# Patient Record
Sex: Female | Born: 1970 | Race: White | Hispanic: No | Marital: Single | Smoking: Current every day smoker
Health system: Southern US, Community
[De-identification: ages and names within clinical notes are randomized; demographics above are authoritative.]

## PROBLEM LIST (undated history)

## (undated) DIAGNOSIS — F419 Anxiety disorder, unspecified: Secondary | ICD-10-CM

## (undated) DIAGNOSIS — Z9889 Other specified postprocedural states: Secondary | ICD-10-CM

## (undated) DIAGNOSIS — Z972 Presence of dental prosthetic device (complete) (partial): Secondary | ICD-10-CM

## (undated) DIAGNOSIS — K219 Gastro-esophageal reflux disease without esophagitis: Secondary | ICD-10-CM

## (undated) DIAGNOSIS — R112 Nausea with vomiting, unspecified: Secondary | ICD-10-CM

## (undated) DIAGNOSIS — F329 Major depressive disorder, single episode, unspecified: Secondary | ICD-10-CM

## (undated) DIAGNOSIS — F32A Depression, unspecified: Secondary | ICD-10-CM

## (undated) DIAGNOSIS — E785 Hyperlipidemia, unspecified: Secondary | ICD-10-CM

## (undated) HISTORY — DX: Anxiety disorder, unspecified: F41.9

## (undated) HISTORY — DX: Hyperlipidemia, unspecified: E78.5

## (undated) HISTORY — PX: MULTIPLE TOOTH EXTRACTIONS: SHX2053

## (undated) HISTORY — DX: Depression, unspecified: F32.A

---
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
## 2000-12-08 ENCOUNTER — Other Ambulatory Visit: Admission: RE | Admit: 2000-12-08 | Discharge: 2000-12-08 | Payer: Self-pay | Admitting: Family Medicine

## 2003-03-12 ENCOUNTER — Other Ambulatory Visit: Admission: RE | Admit: 2003-03-12 | Discharge: 2003-03-12 | Payer: Self-pay | Admitting: Family Medicine

## 2003-03-26 ENCOUNTER — Ambulatory Visit (HOSPITAL_COMMUNITY): Admission: RE | Admit: 2003-03-26 | Discharge: 2003-03-26 | Payer: Self-pay | Admitting: Family Medicine

## 2003-04-23 ENCOUNTER — Ambulatory Visit (HOSPITAL_COMMUNITY): Admission: RE | Admit: 2003-04-23 | Discharge: 2003-04-23 | Payer: Self-pay | Admitting: Family Medicine

## 2003-07-30 ENCOUNTER — Encounter: Admission: RE | Admit: 2003-07-30 | Discharge: 2003-07-30 | Payer: Self-pay | Admitting: Family Medicine

## 2003-07-30 ENCOUNTER — Ambulatory Visit (HOSPITAL_COMMUNITY): Admission: RE | Admit: 2003-07-30 | Discharge: 2003-07-30 | Payer: Self-pay | Admitting: Family Medicine

## 2003-08-06 ENCOUNTER — Ambulatory Visit (HOSPITAL_COMMUNITY): Admission: RE | Admit: 2003-08-06 | Discharge: 2003-08-06 | Payer: Self-pay | Admitting: Family Medicine

## 2003-08-06 ENCOUNTER — Encounter: Admission: RE | Admit: 2003-08-06 | Discharge: 2003-08-06 | Payer: Self-pay | Admitting: Family Medicine

## 2003-08-13 ENCOUNTER — Ambulatory Visit (HOSPITAL_COMMUNITY): Admission: RE | Admit: 2003-08-13 | Discharge: 2003-08-13 | Payer: Self-pay | Admitting: *Deleted

## 2003-08-20 ENCOUNTER — Inpatient Hospital Stay (HOSPITAL_COMMUNITY): Admission: RE | Admit: 2003-08-20 | Discharge: 2003-08-22 | Payer: Self-pay | Admitting: *Deleted

## 2003-08-20 ENCOUNTER — Encounter: Admission: RE | Admit: 2003-08-20 | Discharge: 2003-08-20 | Payer: Self-pay | Admitting: *Deleted

## 2003-08-24 ENCOUNTER — Inpatient Hospital Stay (HOSPITAL_COMMUNITY): Admission: AD | Admit: 2003-08-24 | Discharge: 2003-08-28 | Payer: Self-pay | Admitting: *Deleted

## 2003-08-25 ENCOUNTER — Encounter (INDEPENDENT_AMBULATORY_CARE_PROVIDER_SITE_OTHER): Payer: Self-pay | Admitting: Specialist

## 2004-10-16 ENCOUNTER — Other Ambulatory Visit: Admission: RE | Admit: 2004-10-16 | Discharge: 2004-10-16 | Payer: Self-pay | Admitting: Family Medicine

## 2005-12-24 ENCOUNTER — Other Ambulatory Visit: Admission: RE | Admit: 2005-12-24 | Discharge: 2005-12-24 | Payer: Self-pay | Admitting: Family Medicine

## 2010-01-28 ENCOUNTER — Inpatient Hospital Stay (HOSPITAL_COMMUNITY): Admission: AD | Admit: 2010-01-28 | Discharge: 2010-01-28 | Payer: Self-pay | Admitting: Obstetrics and Gynecology

## 2010-04-21 ENCOUNTER — Inpatient Hospital Stay (HOSPITAL_COMMUNITY): Admission: AD | Admit: 2010-04-21 | Payer: Self-pay | Source: Home / Self Care | Admitting: Obstetrics and Gynecology

## 2010-04-30 ENCOUNTER — Inpatient Hospital Stay (HOSPITAL_COMMUNITY)
Admission: RE | Admit: 2010-04-30 | Discharge: 2010-05-02 | Payer: Self-pay | Source: Home / Self Care | Attending: Obstetrics and Gynecology | Admitting: Obstetrics and Gynecology

## 2010-04-30 LAB — CBC
HCT: 36.7 % (ref 36.0–46.0)
Hemoglobin: 12.1 g/dL (ref 12.0–15.0)
MCH: 28.3 pg (ref 26.0–34.0)
MCHC: 33 g/dL (ref 30.0–36.0)
MCV: 85.9 fL (ref 78.0–100.0)
Platelets: 357 10*3/uL (ref 150–400)
RBC: 4.27 MIL/uL (ref 3.87–5.11)
RDW: 15 % (ref 11.5–15.5)
WBC: 18.3 10*3/uL — ABNORMAL HIGH (ref 4.0–10.5)

## 2010-04-30 LAB — RPR: RPR Ser Ql: NONREACTIVE

## 2010-04-30 LAB — SURGICAL PCR SCREEN
MRSA, PCR: NEGATIVE
Staphylococcus aureus: NEGATIVE

## 2010-05-04 ENCOUNTER — Encounter: Payer: Self-pay | Admitting: *Deleted

## 2010-05-05 LAB — ABO/RH: ABO/RH(D): A NEG

## 2010-05-05 LAB — CBC
HCT: 31.7 % — ABNORMAL LOW (ref 36.0–46.0)
Hemoglobin: 10.4 g/dL — ABNORMAL LOW (ref 12.0–15.0)
MCH: 28.3 pg (ref 26.0–34.0)
MCHC: 32.8 g/dL (ref 30.0–36.0)
MCV: 86.1 fL (ref 78.0–100.0)
Platelets: 319 K/uL (ref 150–400)
RBC: 3.68 MIL/uL — ABNORMAL LOW (ref 3.87–5.11)
RDW: 15.1 % (ref 11.5–15.5)
WBC: 18.2 K/uL — ABNORMAL HIGH (ref 4.0–10.5)

## 2010-05-08 NOTE — Op Note (Addendum)
NAMEJOSEPHINE, [name]              ACCOUNT NO.:  [id number]  MEDICAL RECORD NO.:  [id number]          PATIENT TYPE:  INP  LOCATION:  9148                          FACILITY:  [facility]  PHYSICIAN:  Hal Morales, M.D.DATE OF BIRTH:  1970-09-07  DATE OF PROCEDURE:  04/30/2010 DATE OF DISCHARGE:                              OPERATIVE REPORT   PREOPERATIVE DIAGNOSES:  Intrauterine pregnancy at 48 weeks' gestation, prior cesarean section, desire for repeat cesarean section.  POSTOPERATIVE DIAGNOSES:  Intrauterine pregnancy at 65 weeks' gestation, prior cesarean section, desire for repeat cesarean section.  Plus meconium-stained amniotic fluid.  PROCEDURE:  Repeat low transverse cesarean section.  SURGEON:  Hal Morales, M.D.  FIRST ASSISTANT:  Sanda Klein, CNM  ANESTHESIA:  Spinal.  ESTIMATED BLOOD LOSS:  750 mL.  COMPLICATIONS:  None.  FINDINGS:  The patient was delivered of a female infant weighing 6 pounds 13 ounces with Apgars of 8 and 9 at 1 and 5 minutes respectively. The uterus, tubes, and ovaries were normal for the gravid state.  PROCEDURE:  The patient was taken to the operating room after appropriate identification, placed on the operating table.  After the placement of a spinal anesthetic, she was placed in the supine position with left lateral tilt.  The abdomen and perineum were then prepped and a Foley catheter inserted into the bladder and connected to straight drainage.  The abdomen was draped as a sterile field.  10 mL of quarter percent Marcaine was infiltrated at the site of the previous cesarean section incision.  An incision was made in that area, and the abdomen opened in layers.  The peritoneum was entered and the bladder blade placed.  The uterus was incised approximately 2 cm above the uterovesical fold and that incision was taken laterally on either side bluntly.  The infant was delivered from the occiput transverse position and after  having the cord clamped and cut was handed off to the awaiting pediatricians.  The appropriate cord blood was drawn and the placenta allowed to separate from the uterus and then was removed from the operative field.  The uterine incision was closed with running interlocking suture of 0 Vicryl.  An imbricating suture of 0 Vicryl was then placed with adequate hemostasis.  Copious irrigation was carried out.  The abdominal peritoneum was closed with running suture of 2-0 Vicryl.  The rectus muscles were reapproximated in the midline with figure-of-eight suture of 2-0 Vicryl.  The rectus fascia was closed with running suture of 0 Vicryl then reinforced on either side of midline with figure-of-eight sutures of 0 Vicryl.  The subcutaneous tissue was irrigated and made hemostatic with Bovie cautery.  A subcuticular suture of 3-0 Monocryl was used to close the skin incision and a sterile dressing applied.  The patient was taken from the operating room to the recovery room in satisfactory condition having tolerated the procedure well with sponge and instrument counts correct. The infant went to the full-term nursery.     Hal Morales, M.D.     VPH/MEDQ  D:  04/30/2010  T:  04/30/2010  Job:  [id number]  Electronically Signed  by Dierdre Forth M.D. on 05/08/2010 03:28:53 PM

## 2010-05-13 NOTE — Discharge Summary (Signed)
Tracy [name], Tracy [name]              ACCOUNT NO.:  [id number]  MEDICAL RECORD NO.:  [id number]          PATIENT TYPE:  INP  LOCATION:  9148                          [facility]ILITY:  [facility]  PHYSICIAN:  Janine Limbo, M.D.DATE OF BIRTH:  1970-06-26  DATE OF ADMISSION:  04/30/2010 DATE OF DISCHARGE:  05/02/2010                              DISCHARGE SUMMARY   ADMITTING DIAGNOSES: 1. Intrauterine pregnancy at 41-2/7 weeks. 2. Previous cesarean section with desire for repeat. 3. History of intrauterine growth restriction. 4. Rh negative. 5. Advanced maternal age. 6. Smoker. 7. Late to care. 8. Elevated BMI.  DISCHARGE DIAGNOSES: 1. Intrauterine pregnancy at 41 weeks. 2. Prior cesarean section. 3. Desire for repeat. 4. Meconium-stained fluid. 5. History of intrauterine growth restriction. 6. Rh negative. 7. Advanced maternal age. 8. Smoker. 9. Late to care. 10.Elevated BMI.  PROCEDURES:  Repeat low transverse cesarean section.  HOSPITAL COURSE:  Tracy [name] is a 40 year old, gravida 3, para 1-0-1- 1, at 41-2/7 weeks' who presented on April 30, 2010, for repeat cesarean section.  Her pregnancy had been remarkable for: 1. History of previous cesarean section with now desire for repeat. 2. History of IUGR. 3. Rh negative. 4. Advanced maternal age. 5. Smoker. 6. Late to care. 7. Elevated BMI.  On the arrival, the patient was admitted to preoperative care.  She was then taken to the operating room, where a repeat low transverse cesarean section was performed by Dr. Pennie Rushing under spinal anesthesia.  Findings were a viable female weight 6 pounds 13 ounces, Apgars were 8 and 9. Infant was taken to the full-term nursery.  Mother was taken to recovery in good condition.  By postop day 1, the patient was doing well.  She was up ad lib.  She was using Motrin for pain secondary to a report of intolerance to narcotics per the patient.  Her hemoglobin was 10.4 previous value was  12.1, white blood cell count was 18.2 (it had been 18.3), and platelet count was 319.  Physical exam was within normal limits.  Incision was clean, dry and intact.  Lochia was scant.  The patient was breast-feeding.  She had mild anemia, but was hemodynamically stable.  By the next day, she was still having some trouble breast-feeding, but she was considering going home.  She was undecided about contraception.  She was then evaluated later that day and did desire discharge.  Again, her incision was clean, dry and intact.  She was having no issues and breast-feeding was improving.  She was deemed to receive full benefit of her hospital stay and was discharged home in stable condition.  DISCHARGE INSTRUCTIONS:  Per [facility] handout.  DISCHARGE MEDICATIONS: 1. Motrin 600 mg p.o. q.6 h. p.r.n. pain. 2. Vicodin 1-2 p.o. q.3-4 h. p.r.n. pain per patient request.  Discharge followup will occur in 6 weeks [facility] or p.r.n.     Renaldo Reel Emilee Hero, C.N.M.   ______________________________Arthur Zack Seal, M.D.    VLL/MEDQ  D:  05/03/2010  T:  05/04/2010  Job:  [id number]  Electronically Signed by Nigel Bridgeman C.N.M. on 05/04/2010 06:13:06 PM Electronically Signed by  Kirkland Hun M.D. on 05/13/2010 11:57:16 AM

## 2010-06-25 LAB — RH IMMUNE GLOBULIN WORKUP (NOT WOMEN'S HOSP)
ABO/RH(D): A NEG
Antibody Screen: NEGATIVE
Unit division: 0

## 2010-08-29 NOTE — Discharge Summary (Signed)
NAME:  Tracy [name], Tracy [name]                        ACCOUNT NO.:  [id number]   MEDICAL RECORD NO.:  [id number]                   PATIENT TYPE:  INP   LOCATION:  9116                                 [facility]ACILITY:  [facility]   PHYSICIAN:  Magnus Sinning. Rice, M.D.              DATE OF BIRTH:  1970/07/18   DATE OF ADMISSION:  08/24/2003  DATE OF DISCHARGE:  08/28/2003                                 DISCHARGE SUMMARY   DISCHARGE DIAGNOSES:  1. Intrauterine pregnancy at 37 and 3, delivered.  2. Low transverse cesarean section.  3. Repetitive fetal variable decelerations.  4. Intrauterine growth restriction at less than the fifth percentile.   DISCHARGE MEDICATIONS:  1. Motrin 600 mg p.o. t.i.d. with food.  2. Darvocet-N 100, 1-2 tablets q.4-6h. as needed for pain.  3. Micronor tablets, start on Sep 09, 2003.  4. Prenatal vitamins one per day for as long as breast feeding.   DISCHARGE INSTRUCTIONS:  The patient was discharged to home with follow up  at [facility] in 2 days for incision check and baby  weight check.   HISTORY AND PHYSICAL:  This is a 40 year old, G1, at 61 and 3 by first  trimester ultrasound, found to have increased umbilical Doppler readings  approximately 3 weeks prior to admission.  She was followed with NST's and  repeat Dopplers, and they were found to be normal until approximately 6 days  prior to this admission.  Umbilical Dopplers at that time were again  elevated, and fetal growth decreased from the 25th percentile down to the  less than 5th percentile.  The patient was admitted for induction of labor  with Cytotec and Cervidil used.  No progress made, so discharged home and  brought back 2 days afterwards for this admission.   PAST MEDICAL HISTORY:  Remarkable for tobacco use.   PAST OBSTETRICAL HISTORY:  One TAB in 1991.   MEDICATIONS:  1. Prenatal vitamins.  2. Protonix.  3. Sudafed.   ALLERGIES:  None.   PRENATAL LABORATORIES:  A  negative, antibody negative, RPR nonreactive,  rubella immune.  Hepatitis B surface antigen negative.  HIV negative.  Pap  within normal limits.  GC and Chlamydia negative.  MSAFP negative.  Glucola  112.  GBS negative.   PHYSICAL EXAMINATION:  VITAL SIGNS:  Blood pressure was 125/73, temperature  98 on admission.  ABDOMEN:  Exam otherwise unremarkable, except the abdomen showed a small  baby, approximately 5 pounds by Leopold's __________.  PELVIC:  Cervix was dimpled, external, 80% effaced.  Fetal heart tones were  noted to be 120-130 with accelerations present, no variables, and no  contractions were present on admission.   Dr. Gavin Potters was consulted about the admission, and agreed with care.  The  patient was ripened with Cytotec and contracted without significant change  in her cervix dilation.  She was started on Pitocin and began to have severe  and repetitive fetal variable decelerations.  These failed to improve with  oxygen, position, and fluid boluses.  It was therefore decided to proceed  with a primary low transverse cesarean section.  The patient received that  on Aug 25, 2003.  She recovered normally after the surgery, and went home on  Aug 28, 2003 with the discharge medications and instructions as listed  above.                                               Magnus Sinning. Rice, M.D.    KMR/MEDQ  D:  09/23/2003  T:  09/23/2003  Job:  [id number]

## 2010-08-29 NOTE — Op Note (Signed)
NAME:  Tracy [name], Tracy [name]                        ACCOUNT NO.:  [id number]   MEDICAL RECORD NO.:  [id number]                   PATIENT TYPE:  INP   LOCATION:  9116                                 [facility]   PHYSICIAN:  Magnus Sinning. Rice, M.D.              DATE OF BIRTH:  1970/09/10   DATE OF PROCEDURE:  08/25/2003  DATE OF DISCHARGE:  08/28/2003                                 OPERATIVE REPORT   PREOPERATIVE DIAGNOSES:  1. Intrauterine pregnancy at 37 weeks and three days.  2. Intrauterine growth restriction at less than the fifth percentile.  3. Repetitive fetal variable decelerations.   POSTOPERATIVE DIAGNOSES:  1. Intrauterine pregnancy at 37 weeks and three days.  2. Intrauterine growth restriction at less than the fifth percentile.  3. Repetitive fetal variable decelerations.   SURGEONS:  Conni Elliot, M.D.   ASSISTANT:  Magnus Sinning. Rice, M.D.   PROCEDURE:  Primary low transverse cesarean section.   ANESTHESIA:  Spinal.   ESTIMATED BLOOD LOSS:  500 cc.   URINE OUTPUT:  300 cc clear urine.   IV FLUIDS:  2700 lactated Ringer's.   FINDINGS:  Viable female, with Apgar's 8 at one minute and 9 at five  minutes; 4 pounds 6 ounces.  Cord pH 7.11.   DESCRIPTION OF PROCEDURE:  The patient was brought to the operating room and  her spinal anesthesia was placed and checked to make sure adequate.  A  Pfannenstiel skin incision was made in the low transverse incision, was  extended through to the underlying layer of fascia with the scalpel.  It was  nicked in the midline, extended laterally with Mayo scissors.  The rectus  muscles were separated in the midline.  The peritoneum was tented up and  entered sharply with Metzenbaum  scissors.  Bladder flap was created with  Metzenbaum's and then bluntly dissected.  The uterus was opened with the  scalpel and the incision extended digitally.   The infant's head was delivered atraumatically and suctioned.  The cord was  clamped; baby handed off to the awaiting pediatricians and a cord gas  obtained.   The uterine incision was then closed with running, locked Vicryl.  The  overlying rectus muscles were reapproximated. The fascia was closed with  Vicryl, and the skin was closed with sutures.  The sponge, needle and  instrument counts were correct x2.  The patient was sent to the recovery  room in good condition.                                               Magnus Sinning. Rice, M.D.    KMR/MEDQ  D:  09/23/2003  T:  09/24/2003  Job:  [id number]   cc:   Conni Elliot, M.D.  [address]  [place]  [place] [address]  Fax: [phone]

## 2013-02-14 ENCOUNTER — Encounter: Payer: Self-pay | Admitting: General Practice

## 2013-02-14 ENCOUNTER — Ambulatory Visit (INDEPENDENT_AMBULATORY_CARE_PROVIDER_SITE_OTHER): Payer: 59 | Admitting: General Practice

## 2013-02-14 ENCOUNTER — Encounter (INDEPENDENT_AMBULATORY_CARE_PROVIDER_SITE_OTHER): Payer: Self-pay

## 2013-02-14 VITALS — BP 125/84 | HR 96 | Temp 98.5°F | Ht 59.75 in | Wt 184.0 lb

## 2013-02-14 DIAGNOSIS — Z0000 Encounter for general adult medical examination without abnormal findings: Secondary | ICD-10-CM

## 2013-02-14 DIAGNOSIS — R5383 Other fatigue: Secondary | ICD-10-CM

## 2013-02-14 DIAGNOSIS — N926 Irregular menstruation, unspecified: Secondary | ICD-10-CM

## 2013-02-14 DIAGNOSIS — Z23 Encounter for immunization: Secondary | ICD-10-CM

## 2013-02-14 DIAGNOSIS — R5381 Other malaise: Secondary | ICD-10-CM

## 2013-02-14 DIAGNOSIS — Z124 Encounter for screening for malignant neoplasm of cervix: Secondary | ICD-10-CM

## 2013-02-14 LAB — POCT UA - MICROSCOPIC ONLY
Bacteria, U Microscopic: NEGATIVE
Casts, Ur, LPF, POC: NEGATIVE
Crystals, Ur, HPF, POC: NEGATIVE
Mucus, UA: NEGATIVE
RBC, urine, microscopic: NEGATIVE
WBC, Ur, HPF, POC: NEGATIVE
Yeast, UA: NEGATIVE

## 2013-02-14 LAB — POCT URINALYSIS DIPSTICK
Bilirubin, UA: NEGATIVE
Blood, UA: NEGATIVE
Glucose, UA: NEGATIVE
Ketones, UA: NEGATIVE
Leukocytes, UA: NEGATIVE
Nitrite, UA: NEGATIVE
Protein, UA: NEGATIVE
Spec Grav, UA: 1.01
Urobilinogen, UA: NEGATIVE
pH, UA: 7

## 2013-02-14 LAB — POCT URINE PREGNANCY: Preg Test, Ur: NEGATIVE

## 2013-02-14 NOTE — Patient Instructions (Signed)

## 2013-02-14 NOTE — Progress Notes (Signed)
Subjective:    Patient ID: Tracy [name], female    DOB: 1971-02-01, 42 y.o.   MRN: [id number]  HPI Patient presents today for annual exam. She reports inability to loss weight, feeling more tired and fatigued at times. She reports having an enlarged thyroid in the past and would like thyroid level checked. Reports trying to eat a healthy diet. Reports drinking "alot" of caffeine on a daily basis (3-4 cups of coffee, tea, and caffeinated drinks).  Reports having faint menstrual cycle during October and only using condoms for contraception. She would like a pregnancy test. Reports having a history of fungus and deformed toenails of great toes. Also reports having back pain from large breast (wearing an F-H size cup, depending on bra style).    Review of Systems  Constitutional: Negative for fever and chills.  Respiratory: Negative for chest tightness and shortness of breath.   Cardiovascular: Negative for chest pain and palpitations.  Gastrointestinal: Negative for nausea, vomiting, abdominal pain, diarrhea, constipation and blood in stool.  Genitourinary: Negative for dysuria and difficulty urinating.  Musculoskeletal: Positive for back pain.       Back pain from having large breast.   Neurological: Negative for dizziness, weakness and headaches.       Objective:   Physical Exam  Constitutional: She is oriented to person, place, and time. She appears well-developed and well-nourished.  HENT:  Head: Normocephalic and atraumatic.  Right Ear: External ear normal.  Left Ear: External ear normal.  Mouth/Throat: Oropharynx is clear and moist.  Eyes: Conjunctivae and EOM are normal. Pupils are equal, round, and reactive to light.  Neck: Normal range of motion. Neck supple. No thyromegaly present.  Cardiovascular: Normal rate, regular rhythm, normal heart sounds and intact distal pulses.   Pulmonary/Chest: Effort normal and breath sounds normal. No respiratory distress. Right breast  exhibits no inverted nipple, no mass, no nipple discharge, no skin change and no tenderness. Left breast exhibits no inverted nipple, no mass, no nipple discharge, no skin change and no tenderness. Breasts are symmetrical.  Abdominal: Soft. Bowel sounds are normal. She exhibits no distension.  Genitourinary: Vagina normal and uterus normal. No breast swelling, tenderness, discharge or bleeding. No labial fusion. There is no rash, tenderness, lesion or injury on the right labia. There is no rash, tenderness, lesion or injury on the left labia. Uterus is not deviated, not enlarged, not fixed and not tender. Cervix exhibits no motion tenderness, no discharge and no friability. Right adnexum displays no mass, no tenderness and no fullness. Left adnexum displays no mass, no tenderness and no fullness. No erythema, tenderness or bleeding around the vagina. No foreign body around the vagina. No signs of injury around the vagina. No vaginal discharge found.  Musculoskeletal: She exhibits tenderness.  Tenderness noted to upper back and shoulder area with palpation  Lymphadenopathy:    She has no cervical adenopathy.  Neurological: She is alert and oriented to person, place, and time.  Skin: Skin is warm and dry.  Bilateral shoulder indentations noted from bras straps.  Bilateral great toenails, yellowish, and concave shape.   Psychiatric: She has a normal mood and affect.   Results for orders placed in visit on 02/14/13  POCT UA - MICROSCOPIC ONLY      Result Value Range   WBC, Ur, HPF, POC neg     RBC, urine, microscopic neg     Bacteria, U Microscopic neg     Mucus, UA neg     Epithelial  cells, urine per micros occ     Crystals, Ur, HPF, POC neg     Casts, Ur, LPF, POC neg     Yeast, UA neg    POCT URINALYSIS DIPSTICK      Result Value Range   Color, UA yellow     Clarity, UA clear     Glucose, UA neg     Bilirubin, UA neg     Ketones, UA neg     Spec Grav, UA 1.010     Blood, UA neg      pH, UA 7.0     Protein, UA neg     Urobilinogen, UA negative     Nitrite, UA neg     Leukocytes, UA Negative    POCT URINE PREGNANCY      Result Value Range   Preg Test, Ur Negative           Assessment & Plan:  1. Annual physical exam  - POCT UA - Microscopic Only - POCT urinalysis dipstick - CMP14+EGFR - NMR, lipoprofile - Pap IG w/ reflex to HPV when ASC-U -patient to set up an appointment for mammogram  2. Abnormal menses  - POCT urine pregnancy -RTO if symptoms worsen -Will refer to plastic surgeon for breast reduction consultation (patient to call with surgeon preference) -will refer to podiatrist (patient to call with podiatrist preference) Continue all current medications Labs pending Discussed exercise and diet  Patient verbalized understanding Coralie Keens, FNP-C

## 2013-02-16 ENCOUNTER — Other Ambulatory Visit: Payer: Self-pay | Admitting: General Practice

## 2013-02-16 LAB — CMP14+EGFR
ALT: 16 IU/L (ref 0–32)
AST: 14 IU/L (ref 0–40)
Albumin/Globulin Ratio: 1.8 (ref 1.1–2.5)
Albumin: 4.4 g/dL (ref 3.5–5.5)
Alkaline Phosphatase: 69 IU/L (ref 39–117)
BUN/Creatinine Ratio: 7 — ABNORMAL LOW (ref 9–23)
BUN: 5 mg/dL — ABNORMAL LOW (ref 6–24)
CO2: 26 mmol/L (ref 18–29)
Calcium: 9 mg/dL (ref 8.7–10.2)
Chloride: 101 mmol/L (ref 97–108)
Creatinine, Ser: 0.73 mg/dL (ref 0.57–1.00)
GFR calc Af Amer: 119 mL/min/{1.73_m2} (ref >59)
GFR calc non Af Amer: 103 mL/min/{1.73_m2} (ref >59)
Globulin, Total: 2.5 g/dL (ref 1.5–4.5)
Glucose: 77 mg/dL (ref 65–99)
Potassium: 4.8 mmol/L (ref 3.5–5.2)
Sodium: 142 mmol/L (ref 134–144)
Total Bilirubin: <0.2 mg/dL (ref 0.0–1.2)
Total Protein: 6.9 g/dL (ref 6.0–8.5)

## 2013-02-16 LAB — NMR, LIPOPROFILE
Cholesterol: 215 mg/dL — ABNORMAL HIGH (ref <200)
HDL Cholesterol by NMR: 44 mg/dL (ref >=40)
HDL Particle Number: 26.8 umol/L — ABNORMAL LOW (ref >=30.5)
LDL Particle Number: 2028 nmol/L — ABNORMAL HIGH (ref <1000)
LDL Size: 20.9 nm (ref >20.5)
LDLC SERPL CALC-MCNC: 152 mg/dL — ABNORMAL HIGH (ref <100)
LP-IR Score: 50 — ABNORMAL HIGH (ref <=45)
Small LDL Particle Number: 1034 nmol/L — ABNORMAL HIGH (ref <=527)
Triglycerides by NMR: 95 mg/dL (ref <150)

## 2013-02-16 LAB — THYROID PANEL WITH TSH
Free Thyroxine Index: 1.7 (ref 1.2–4.9)
T3 Uptake Ratio: 22 % — ABNORMAL LOW (ref 24–39)
T4, Total: 7.9 ug/dL (ref 4.5–12.0)
TSH: 0.737 u[IU]/mL (ref 0.450–4.500)

## 2013-02-18 LAB — PAP IG W/ RFLX HPV ASCU: PAP Smear Comment: 0

## 2013-02-22 ENCOUNTER — Other Ambulatory Visit: Payer: Self-pay | Admitting: General Practice

## 2013-02-22 DIAGNOSIS — E785 Hyperlipidemia, unspecified: Secondary | ICD-10-CM

## 2013-02-22 MED ORDER — ATORVASTATIN CALCIUM 20 MG PO TABS: 20.0000 mg | ORAL_TABLET | Freq: Every day | ORAL | Status: DC

## 2013-05-18 ENCOUNTER — Telehealth: Payer: Self-pay | Admitting: General Practice

## 2013-05-18 NOTE — Telephone Encounter (Signed)
Appt given for tomorrow per patients request 

## 2013-05-19 ENCOUNTER — Ambulatory Visit (INDEPENDENT_AMBULATORY_CARE_PROVIDER_SITE_OTHER): Payer: 59 | Admitting: General Practice

## 2013-05-19 ENCOUNTER — Encounter: Payer: Self-pay | Admitting: General Practice

## 2013-05-19 VITALS — BP 117/80 | HR 76 | Temp 98.8°F | Ht 59.75 in | Wt 181.5 lb

## 2013-05-19 DIAGNOSIS — R1011 Right upper quadrant pain: Secondary | ICD-10-CM

## 2013-05-19 LAB — POCT CBC
Granulocyte percent: 65.2 %G (ref 37–80)
HCT, POC: 45 % (ref 37.7–47.9)
Hemoglobin: 13.9 g/dL (ref 12.2–16.2)
Lymph, poc: 3.7 — AB (ref 0.6–3.4)
MCH, POC: 26.1 pg — AB (ref 27–31.2)
MCHC: 30.8 g/dL — AB (ref 31.8–35.4)
MCV: 84.8 fL (ref 80–97)
MPV: 7.5 fL (ref 0–99.8)
POC Granulocyte: 8 — AB (ref 2–6.9)
POC LYMPH PERCENT: 30.3 %L (ref 10–50)
Platelet Count, POC: 340 10*3/uL (ref 142–424)
RBC: 5.3 M/uL (ref 4.04–5.48)
RDW, POC: 15.2 %
WBC: 12.3 10*3/uL — AB (ref 4.6–10.2)

## 2013-05-19 NOTE — Patient Instructions (Signed)
Abdominal Pain, Adult °Many things can cause abdominal pain. Usually, abdominal pain is not caused by a disease and will improve without treatment. It can often be observed and treated at home. Your health care provider will do a physical exam and possibly order blood tests and X-rays to help determine the seriousness of your pain. However, in many cases, more time must pass before a clear cause of the pain can be found. Before that point, your health care provider may not know if you need more testing or further treatment. °HOME CARE INSTRUCTIONS  °Monitor your abdominal pain for any changes. The following actions may help to alleviate any discomfort you are experiencing: °· Only take over-the-counter or prescription medicines as directed by your health care provider. °· Do not take laxatives unless directed to do so by your health care provider. °· Try a clear liquid diet (broth, tea, or water) as directed by your health care provider. Slowly move to a bland diet as tolerated. °SEEK MEDICAL CARE IF: °· You have unexplained abdominal pain. °· You have abdominal pain associated with nausea or diarrhea. °· You have pain when you urinate or have a bowel movement. °· You experience abdominal pain that wakes you in the night. °· You have abdominal pain that is worsened or improved by eating food. °· You have abdominal pain that is worsened with eating fatty foods. °SEEK IMMEDIATE MEDICAL CARE IF:  °· Your pain does not go away within 2 hours. °· You have a fever. °· You keep throwing up (vomiting). °· Your pain is felt only in portions of the abdomen, such as the right side or the left lower portion of the abdomen. °· You pass bloody or black tarry stools. °MAKE SURE YOU: °· Understand these instructions.   °· Will watch your condition.   °· Will get help right away if you are not doing well or get worse.   °Document Released: 01/07/2005 Document Revised: 01/18/2013 Document Reviewed: 12/07/2012 °[employer]® Patient  Information ©2014 [employer]. ° °

## 2013-05-19 NOTE — Progress Notes (Signed)
   Subjective:    Patient ID: Tracy [name], female    DOB: 09-16-1970, 43 y.o.   MRN: [id number]  HPI Patient presents today with complaints of right upper quadrant pain. Onset was 4 years ago and has gradually gotten worse. Denies aggravating factors. Has been awaken out of sleep with pain. Rates pain as as 10 on 1-10 scale when episodes occur.     Review of Systems  Constitutional: Negative for fever and chills.  Respiratory: Negative for chest tightness and shortness of breath.   Cardiovascular: Negative for chest pain and palpitations.  Gastrointestinal: Positive for abdominal pain. Negative for nausea, vomiting, diarrhea, constipation and blood in stool.       Objective:   Physical Exam  Constitutional: She is oriented to person, place, and time. She appears well-developed and well-nourished.  Cardiovascular: Normal rate, regular rhythm and normal heart sounds.   Pulmonary/Chest: Effort normal and breath sounds normal. No respiratory distress. She exhibits no tenderness.  Abdominal: Soft. Bowel sounds are normal. There is tenderness in the right upper quadrant.  Neurological: She is alert and oriented to person, place, and time.  Skin: Skin is dry.  Psychiatric: She has a normal mood and affect.          Assessment & Plan:  1. RUQ pain - US Abdomen Limited RUQ; Future (scheduled for Monday Feb. 9, 2015) -seek emergency medical treatment, if needed - POCT CBC -Patient verbalized understanding Coralie KeensMae E. Zoraya Fiorenza, FNP-C

## 2013-05-22 ENCOUNTER — Ambulatory Visit (HOSPITAL_COMMUNITY)
Admission: RE | Admit: 2013-05-22 | Discharge: 2013-05-22 | Disposition: A | Discharge status: Home / Self Care | Payer: 59 | Source: Ambulatory Visit | Attending: General Practice | Admitting: General Practice

## 2013-05-22 DIAGNOSIS — R1011 Right upper quadrant pain: Secondary | ICD-10-CM

## 2013-05-22 DIAGNOSIS — K8020 Calculus of gallbladder without cholecystitis without obstruction: Secondary | ICD-10-CM | POA: Insufficient documentation

## 2013-05-24 ENCOUNTER — Telehealth: Payer: Self-pay | Admitting: General Practice

## 2013-05-24 ENCOUNTER — Other Ambulatory Visit: Payer: Self-pay | Admitting: General Practice

## 2013-05-24 DIAGNOSIS — K8020 Calculus of gallbladder without cholecystitis without obstruction: Secondary | ICD-10-CM

## 2013-05-25 NOTE — Telephone Encounter (Signed)
Discussed results with patient and referral made

## 2013-06-12 ENCOUNTER — Ambulatory Visit (INDEPENDENT_AMBULATORY_CARE_PROVIDER_SITE_OTHER): Payer: 59 | Admitting: Surgery

## 2013-06-16 ENCOUNTER — Encounter (INDEPENDENT_AMBULATORY_CARE_PROVIDER_SITE_OTHER): Payer: Self-pay | Admitting: Surgery

## 2013-06-16 ENCOUNTER — Ambulatory Visit (INDEPENDENT_AMBULATORY_CARE_PROVIDER_SITE_OTHER): Payer: 59 | Admitting: Surgery

## 2013-06-16 VITALS — BP 112/74 | HR 86 | Resp 14 | Ht 59.7 in | Wt 185.2 lb

## 2013-06-16 DIAGNOSIS — K8000 Calculus of gallbladder with acute cholecystitis without obstruction: Secondary | ICD-10-CM

## 2013-06-16 NOTE — Progress Notes (Signed)
Patient ID: Tracy [name], female   DOB: 1971-03-11, 43 y.o.   MRN: [id number]  No chief complaint on file.   HPI Tracy [name] is [name] 43 y.o. female.  Patient sent at request of Dr. Dorinda Hillonald More for right upper quadrant abdominal pain. This is been going on for [name] number of months. The pain comes and goes. Location is right upper quadrant of abdomen. There is no radiation of pain. It happens every few weeks or so. It lasts for [name] few hours and goes away on its. Made worse by fatty food. Ultrasound shows gallstones mildly thickened gallbladder wall. HPI  Past Medical History  Diagnosis Date  . Hyperlipidemia     Past Surgical History  Procedure Laterality Date  . Cesarean section      Two    Family History  Problem Relation Age of Onset  . COPD Mother   . Heart disease Mother   . Heart disease Father   . Diabetes Father   . Heart disease Brother   . Breast cancer      aunt  . Colon cancer      great uncle  . Lung cancer      uncle  . Esophageal cancer      grandfather    Social History History  Substance Use Topics  . Smoking status: Current Every Day Smoker  . Smokeless tobacco: Not on file  . Alcohol Use: No    Allergies  Allergen Reactions  . Codeine     Current Outpatient Prescriptions  Medication Sig Dispense Refill  . atorvastatin (LIPITOR) 20 MG tablet Take 1 tablet (20 mg total) by mouth daily.  30 tablet  3  . Digestive Enzymes (ACIDOLL PO) Take by mouth.      . FIBER PO Take by mouth.      . Omega-3 Fatty Acids (FISH OIL PO) Take by mouth.      Marland Kitchen. omeprazole (PRILOSEC) 20 MG capsule Take 20 mg by mouth daily.      . Probiotic Product (PROBIOTIC DAILY PO) Take by mouth.      . Red Yeast Rice Extract (RED YEAST RICE PO) Take by mouth.       No current facility-administered medications for this visit.    Review of Systems Review of Systems  Constitutional: Negative for fever, chills and unexpected weight change.  HENT: Negative for congestion,  hearing loss, sore throat, trouble swallowing and voice change.   Eyes: Negative for visual disturbance.  Respiratory: Negative for cough and wheezing.   Cardiovascular: Negative for chest pain, palpitations and leg swelling.  Gastrointestinal: Positive for abdominal pain. Negative for nausea, vomiting, diarrhea, constipation, blood in stool, abdominal distention and anal bleeding.  Genitourinary: Negative for hematuria, vaginal bleeding and difficulty urinating.  Musculoskeletal: Negative for arthralgias.  Skin: Negative for rash and wound.  Neurological: Negative for seizures, syncope and headaches.  Hematological: Negative for adenopathy. Does not bruise/bleed easily.  Psychiatric/Behavioral: Negative for confusion.    Blood pressure 112/74, pulse 86, resp. rate 14, height 4' 11.7" (1.516 m), weight 185 lb 3.2 oz (84.006 kg).  Physical Exam Physical Exam  Constitutional: She is oriented to person, place, and time. She appears well-developed and well-nourished.  HENT:  Head: Normocephalic and atraumatic.  Eyes: Pupils are equal, round, and reactive to light. No scleral icterus.  Neck: Normal range of motion. Neck supple.  Cardiovascular: Normal rate and regular rhythm.   Pulmonary/Chest: Effort normal and breath sounds normal.  Abdominal: Soft.  Bowel sounds are normal. She exhibits no distension. There is no tenderness.  Musculoskeletal: Normal range of motion.  Neurological: She is alert and oriented to person, place, and time.  Skin: Skin is warm and dry.  Psychiatric: She has [name] normal mood and affect. Her behavior is normal. Judgment and thought content normal.    Data Reviewed U/S gallstones with mildly thicken GB wall.  CBD 5 mm.   Assessment    Symptomatic cholelithiasis PONV    Plan    Recommend laparoscopic cholecystectomy and cholangiogram.The procedure has been discussed with the patient. Operative and non operative treatments have been discussed. Risks of  surgery include bleeding, infection,  Common bile duct injury,  Injury to the stomach,liver, colon,small intestine, abdominal wall,  Diaphragm,  Major blood vessels,  And the need for an open procedure.  Other risks include worsening of medical problems, death,  DVT and pulmonary embolism, and cardiovascular events.   Medical options have also been discussed. The patient has been informed of long term expectations of surgery and non surgical options,  The patient agrees to proceed.         Tracy [name]. 06/16/2013, 9:37 AM

## 2013-06-16 NOTE — Patient Instructions (Signed)
Laparoscopic Cholecystectomy °Laparoscopic cholecystectomy is surgery to remove the gallbladder. The gallbladder is located in the upper right part of the abdomen, behind the liver. It is a storage sac for bile produced in the liver. Bile aids in the digestion and absorption of fats. Cholecystectomy is often done for inflammation of the gallbladder (cholecystitis). This condition is usually caused by a buildup of gallstones (cholelithiasis) in your gallbladder. Gallstones can block the flow of bile, resulting in inflammation and pain. In severe cases, emergency surgery may be required. When emergency surgery is not required, you will have time to prepare for the procedure. °Laparoscopic surgery is an alternative to open surgery. Laparoscopic surgery has a shorter recovery time. Your common bile duct may also need to be examined during the procedure. If stones are found in the common bile duct, they may be removed. °LET YOUR HEALTH CARE PROVIDER KNOW ABOUT: °· Any allergies you have. °· All medicines you are taking, including vitamins, herbs, eye drops, creams, and over-the-counter medicines. °· Previous problems you or members of your family have had with the use of anesthetics. °· Any blood disorders you have. °· Previous surgeries you have had. °· Medical conditions you have. °RISKS AND COMPLICATIONS °Generally, this is a safe procedure. However, as with any procedure, complications can occur. Possible complications include: °· Infection. °· Damage to the common bile duct, nerves, arteries, veins, or other internal organs such as the stomach, liver, or intestines. °· Bleeding. °· A stone may remain in the common bile duct. °· A bile leak from the cyst duct that is clipped when your gallbladder is removed. °· The need to convert to open surgery, which requires a larger incision in the abdomen. This may be necessary if your surgeon thinks it is not safe to continue with a laparoscopic procedure. °BEFORE THE  PROCEDURE °· Ask your health care provider about changing or stopping any regular medicines. You will need to stop taking aspirin or blood thinners at least 5 days prior to surgery. °· Do not eat or drink anything after midnight the night before surgery. °· Let your health care provider know if you develop a cold or other infectious problem before surgery. °PROCEDURE  °· You will be given medicine to make you sleep through the procedure (general anesthetic). A breathing tube will be placed in your mouth. °· When you are asleep, your surgeon will make several small cuts (incisions) in your abdomen. °· A thin, lighted tube with a tiny camera on the end (laparoscope) is inserted through one of the small incisions. The camera on the laparoscope sends a picture to a TV screen in the operating room. This gives the surgeon a good view inside your abdomen. °· A gas will be pumped into your abdomen. This expands your abdomen so that the surgeon has more room to perform the surgery. °· Other tools needed for the procedure are inserted through the other incisions. The gallbladder is removed through one of the incisions. °· After the removal of your gallbladder, the incisions will be closed with stitches, staples, or skin glue. °AFTER THE PROCEDURE °· You will be taken to a recovery area where your progress will be checked often. °· You may be allowed to go home the same day if your pain is controlled and you can tolerate liquids. °Document Released: 03/30/2005 Document Revised: 01/18/2013 Document Reviewed: 11/09/2012 °[employer]® Patient Information ©2014 [employer]. ° °

## 2013-07-06 ENCOUNTER — Encounter (HOSPITAL_BASED_OUTPATIENT_CLINIC_OR_DEPARTMENT_OTHER): Payer: Self-pay | Admitting: *Deleted

## 2013-07-06 NOTE — Progress Notes (Signed)
To come in for [facility] labs  

## 2013-07-07 ENCOUNTER — Encounter (HOSPITAL_BASED_OUTPATIENT_CLINIC_OR_DEPARTMENT_OTHER)
Admission: RE | Admit: 2013-07-07 | Discharge: 2013-07-07 | Disposition: A | Discharge status: Home / Self Care | Payer: 59 | Source: Ambulatory Visit | Attending: Surgery | Admitting: Surgery

## 2013-07-07 DIAGNOSIS — Z01812 Encounter for preprocedural laboratory examination: Secondary | ICD-10-CM | POA: Insufficient documentation

## 2013-07-07 LAB — COMPREHENSIVE METABOLIC PANEL
ALT: 16 U/L (ref 0–35)
AST: 16 U/L (ref 0–37)
Albumin: 3.5 g/dL (ref 3.5–5.2)
Alkaline Phosphatase: 63 U/L (ref 39–117)
BUN: 6 mg/dL (ref 6–23)
CO2: 27 mEq/L (ref 19–32)
Calcium: 9.2 mg/dL (ref 8.4–10.5)
Chloride: 101 mEq/L (ref 96–112)
Creatinine, Ser: 0.68 mg/dL (ref 0.50–1.10)
GFR calc Af Amer: >90 mL/min (ref >90)
GFR calc non Af Amer: >90 mL/min (ref >90)
Glucose, Bld: 155 mg/dL — ABNORMAL HIGH (ref 70–99)
Potassium: 4.7 mEq/L (ref 3.7–5.3)
Sodium: 139 mEq/L (ref 137–147)
Total Bilirubin: 0.2 mg/dL — ABNORMAL LOW (ref 0.3–1.2)
Total Protein: 7 g/dL (ref 6.0–8.3)

## 2013-07-07 LAB — CBC WITH DIFFERENTIAL/PLATELET
Basophils Absolute: 0 10*3/uL (ref 0.0–0.1)
Basophils Relative: 0 % (ref 0–1)
Eosinophils Absolute: 0.2 10*3/uL (ref 0.0–0.7)
Eosinophils Relative: 1 % (ref 0–5)
HCT: 41 % (ref 36.0–46.0)
Hemoglobin: 13.8 g/dL (ref 12.0–15.0)
Lymphocytes Relative: 14 % (ref 12–46)
Lymphs Abs: 2.6 10*3/uL (ref 0.7–4.0)
MCH: 28.9 pg (ref 26.0–34.0)
MCHC: 33.7 g/dL (ref 30.0–36.0)
MCV: 86 fL (ref 78.0–100.0)
Monocytes Absolute: 0.8 10*3/uL (ref 0.1–1.0)
Monocytes Relative: 4 % (ref 3–12)
Neutro Abs: 15.1 10*3/uL — ABNORMAL HIGH (ref 1.7–7.7)
Neutrophils Relative %: 81 % — ABNORMAL HIGH (ref 43–77)
Platelets: 351 10*3/uL (ref 150–400)
RBC: 4.77 MIL/uL (ref 3.87–5.11)
RDW: 14.4 % (ref 11.5–15.5)
WBC: 18.7 10*3/uL — ABNORMAL HIGH (ref 4.0–10.5)

## 2013-07-12 ENCOUNTER — Ambulatory Visit (HOSPITAL_BASED_OUTPATIENT_CLINIC_OR_DEPARTMENT_OTHER): Payer: 59 | Admitting: Anesthesiology

## 2013-07-12 ENCOUNTER — Encounter (HOSPITAL_BASED_OUTPATIENT_CLINIC_OR_DEPARTMENT_OTHER): Payer: Self-pay | Admitting: *Deleted

## 2013-07-12 ENCOUNTER — Encounter (HOSPITAL_BASED_OUTPATIENT_CLINIC_OR_DEPARTMENT_OTHER)
Admission: RE | Disposition: A | Discharge status: Home / Self Care | Payer: Self-pay | Source: Ambulatory Visit | Attending: Surgery

## 2013-07-12 ENCOUNTER — Ambulatory Visit (HOSPITAL_COMMUNITY): Payer: 59

## 2013-07-12 ENCOUNTER — Ambulatory Visit (HOSPITAL_BASED_OUTPATIENT_CLINIC_OR_DEPARTMENT_OTHER)
Admission: RE | Admit: 2013-07-12 | Discharge: 2013-07-12 | Disposition: A | Discharge status: Home / Self Care | Payer: 59 | Source: Ambulatory Visit | Attending: Surgery | Admitting: Surgery

## 2013-07-12 ENCOUNTER — Encounter (HOSPITAL_BASED_OUTPATIENT_CLINIC_OR_DEPARTMENT_OTHER): Payer: 59 | Admitting: Anesthesiology

## 2013-07-12 DIAGNOSIS — K219 Gastro-esophageal reflux disease without esophagitis: Secondary | ICD-10-CM | POA: Insufficient documentation

## 2013-07-12 DIAGNOSIS — E785 Hyperlipidemia, unspecified: Secondary | ICD-10-CM | POA: Insufficient documentation

## 2013-07-12 DIAGNOSIS — K8010 Calculus of gallbladder with chronic cholecystitis without obstruction: Secondary | ICD-10-CM

## 2013-07-12 DIAGNOSIS — F17200 Nicotine dependence, unspecified, uncomplicated: Secondary | ICD-10-CM | POA: Insufficient documentation

## 2013-07-12 DIAGNOSIS — K8000 Calculus of gallbladder with acute cholecystitis without obstruction: Secondary | ICD-10-CM

## 2013-07-12 HISTORY — DX: Gastro-esophageal reflux disease without esophagitis: K21.9

## 2013-07-12 HISTORY — PX: CHOLECYSTECTOMY: SHX55

## 2013-07-12 HISTORY — DX: Presence of dental prosthetic device (complete) (partial): Z97.2

## 2013-07-12 HISTORY — DX: Other specified postprocedural states: Z98.890

## 2013-07-12 HISTORY — DX: Other specified postprocedural states: R11.2

## 2013-07-12 SURGERY — LAPAROSCOPIC CHOLECYSTECTOMY WITH INTRAOPERATIVE CHOLANGIOGRAM
Anesthesia: General | Site: Abdomen

## 2013-07-12 MED ORDER — HYDROMORPHONE HCL PF 1 MG/ML IJ SOLN: 0.2500 mg | INTRAMUSCULAR | Status: DC | PRN

## 2013-07-12 MED ORDER — OXYCODONE-ACETAMINOPHEN 5-325 MG PO TABS: 1.0000 | ORAL_TABLET | ORAL | Status: DC | PRN

## 2013-07-12 MED ORDER — BUPIVACAINE-EPINEPHRINE PF 0.25-1:200000 % IJ SOLN
INTRAMUSCULAR | Status: AC
  Filled 2013-07-12: qty 30

## 2013-07-12 MED ORDER — OXYCODONE HCL 5 MG PO TABS
ORAL_TABLET | ORAL | Status: AC
  Filled 2013-07-12: qty 1

## 2013-07-12 MED ORDER — OXYCODONE HCL 5 MG PO TABS
5.0000 mg | ORAL_TABLET | Freq: Once | ORAL | Status: AC | PRN
  Administered 2013-07-12: 5 mg via ORAL

## 2013-07-12 MED ORDER — BUPIVACAINE-EPINEPHRINE 0.25% -1:200000 IJ SOLN
INTRAMUSCULAR | Status: DC | PRN
  Administered 2013-07-12: 6 mL

## 2013-07-12 MED ORDER — LACTATED RINGERS IV SOLN
INTRAVENOUS | Status: DC
  Administered 2013-07-12 (×3): via INTRAVENOUS

## 2013-07-12 MED ORDER — FENTANYL CITRATE 0.05 MG/ML IJ SOLN
INTRAMUSCULAR | Status: DC | PRN
  Administered 2013-07-12 (×4): 50 ug via INTRAVENOUS
  Administered 2013-07-12: 100 ug via INTRAVENOUS

## 2013-07-12 MED ORDER — FENTANYL CITRATE 0.05 MG/ML IJ SOLN
INTRAMUSCULAR | Status: AC
  Filled 2013-07-12: qty 6

## 2013-07-12 MED ORDER — FENTANYL CITRATE 0.05 MG/ML IJ SOLN: 50.0000 ug | INTRAMUSCULAR | Status: DC | PRN

## 2013-07-12 MED ORDER — MIDAZOLAM HCL 5 MG/5ML IJ SOLN
INTRAMUSCULAR | Status: DC | PRN
  Administered 2013-07-12: 2 mg via INTRAVENOUS

## 2013-07-12 MED ORDER — SCOPOLAMINE 1 MG/3DAYS TD PT72
MEDICATED_PATCH | TRANSDERMAL | Status: AC
  Filled 2013-07-12: qty 1

## 2013-07-12 MED ORDER — SCOPOLAMINE 1 MG/3DAYS TD PT72: 1.0000 | MEDICATED_PATCH | TRANSDERMAL | Status: DC

## 2013-07-12 MED ORDER — PROMETHAZINE HCL 12.5 MG PO TABS
12.5000 mg | ORAL_TABLET | Freq: Four times a day (QID) | ORAL | Status: DC | PRN
Start: 2013-07-12 — End: 2014-06-23

## 2013-07-12 MED ORDER — FENTANYL CITRATE 0.05 MG/ML IJ SOLN
INTRAMUSCULAR | Status: AC
  Filled 2013-07-12: qty 2

## 2013-07-12 MED ORDER — LIDOCAINE HCL (CARDIAC) 20 MG/ML IV SOLN
INTRAVENOUS | Status: DC | PRN
  Administered 2013-07-12: 40 mg via INTRAVENOUS

## 2013-07-12 MED ORDER — MIDAZOLAM HCL 2 MG/2ML IJ SOLN: 1.0000 mg | INTRAMUSCULAR | Status: DC | PRN

## 2013-07-12 MED ORDER — GLYCOPYRROLATE 0.2 MG/ML IJ SOLN
INTRAMUSCULAR | Status: DC | PRN
  Administered 2013-07-12: 0.4 mg via INTRAVENOUS

## 2013-07-12 MED ORDER — KETOROLAC TROMETHAMINE 30 MG/ML IJ SOLN
INTRAMUSCULAR | Status: DC | PRN
  Administered 2013-07-12: 30 mg via INTRAVENOUS

## 2013-07-12 MED ORDER — CHLORHEXIDINE GLUCONATE 4 % EX LIQD: 1.0000 "application " | Freq: Once | CUTANEOUS | Status: DC

## 2013-07-12 MED ORDER — ROCURONIUM BROMIDE 100 MG/10ML IV SOLN
INTRAVENOUS | Status: DC | PRN
  Administered 2013-07-12 (×2): 20 mg via INTRAVENOUS

## 2013-07-12 MED ORDER — PROPOFOL 10 MG/ML IV BOLUS
INTRAVENOUS | Status: DC | PRN
  Administered 2013-07-12 (×2): 20 mg via INTRAVENOUS
  Administered 2013-07-12: 200 mg via INTRAVENOUS

## 2013-07-12 MED ORDER — DEXAMETHASONE SODIUM PHOSPHATE 4 MG/ML IJ SOLN
INTRAMUSCULAR | Status: DC | PRN
  Administered 2013-07-12: 10 mg via INTRAVENOUS

## 2013-07-12 MED ORDER — ONDANSETRON HCL 4 MG/2ML IJ SOLN
INTRAMUSCULAR | Status: DC | PRN
  Administered 2013-07-12: 4 mg via INTRAVENOUS

## 2013-07-12 MED ORDER — CEFAZOLIN SODIUM-DEXTROSE 2-3 GM-% IV SOLR
2.0000 g | INTRAVENOUS | Status: AC
  Administered 2013-07-12: 2 g via INTRAVENOUS

## 2013-07-12 MED ORDER — OXYCODONE HCL 5 MG/5ML PO SOLN: 5.0000 mg | Freq: Once | ORAL | Status: AC | PRN

## 2013-07-12 MED ORDER — SODIUM CHLORIDE 0.9 % IR SOLN
Status: DC | PRN
  Administered 2013-07-12: 1

## 2013-07-12 MED ORDER — NEOSTIGMINE METHYLSULFATE 1 MG/ML IJ SOLN
INTRAMUSCULAR | Status: DC | PRN
  Administered 2013-07-12: 3 mg via INTRAVENOUS

## 2013-07-12 MED ORDER — SODIUM CHLORIDE 0.9 % IV SOLN
INTRAVENOUS | Status: DC | PRN
  Administered 2013-07-12: 12:00:00

## 2013-07-12 MED ORDER — MIDAZOLAM HCL 2 MG/2ML IJ SOLN
INTRAMUSCULAR | Status: AC
  Filled 2013-07-12: qty 2

## 2013-07-12 MED ORDER — SUCCINYLCHOLINE CHLORIDE 20 MG/ML IJ SOLN
INTRAMUSCULAR | Status: DC | PRN
  Administered 2013-07-12: 100 mg via INTRAVENOUS

## 2013-07-12 SURGICAL SUPPLY — 47 items
ADH SKN CLS APL DERMABOND .7 (GAUZE/BANDAGES/DRESSINGS) ×1
APPLIER CLIP ROT 10 11.4 M/L (STAPLE) ×2
APR CLP MED LRG 11.4X10 (STAPLE) ×1
BAG SPEC RTRVL LRG 6X4 10 (ENDOMECHANICALS) ×1
BLADE SURG ROTATE 9660 (MISCELLANEOUS) IMPLANT
CANISTER SUCT 1200ML W/VALVE (MISCELLANEOUS) ×2 IMPLANT
CHLORAPREP W/TINT 26ML (MISCELLANEOUS) ×2 IMPLANT
CLIP APPLIE ROT 10 11.4 M/L (STAPLE) ×1 IMPLANT
COVER MAYO STAND STRL (DRAPES) ×2 IMPLANT
DECANTER SPIKE VIAL GLASS SM (MISCELLANEOUS) IMPLANT
DERMABOND ADVANCED (GAUZE/BANDAGES/DRESSINGS) ×1
DERMABOND ADVANCED .7 DNX12 (GAUZE/BANDAGES/DRESSINGS) ×1 IMPLANT
DRAPE C-ARM 42X72 X-RAY (DRAPES) ×2 IMPLANT
DRAPE UTILITY XL STRL (DRAPES) ×2 IMPLANT
ELECT REM PT RETURN 9FT ADLT (ELECTROSURGICAL) ×2
ELECTRODE REM PT RTRN 9FT ADLT (ELECTROSURGICAL) ×1 IMPLANT
FILTER SMOKE EVAC LAPAROSHD (FILTER) ×1 IMPLANT
GLOVE BIO SURGEON STRL SZ7.5 (GLOVE) ×1 IMPLANT
GLOVE BIO SURGEON STRL SZ8 (GLOVE) ×2 IMPLANT
GLOVE BIOGEL M 7.0 STRL (GLOVE) ×1 IMPLANT
GLOVE BIOGEL PI IND STRL 7.5 (GLOVE) IMPLANT
GLOVE BIOGEL PI IND STRL 8 (GLOVE) ×1 IMPLANT
GLOVE BIOGEL PI INDICATOR 7.5 (GLOVE) ×2
GLOVE BIOGEL PI INDICATOR 8 (GLOVE) ×1
GLOVE EXAM NITRILE PF MED BLUE (GLOVE) ×1 IMPLANT
GOWN STRL REUS W/ TWL LRG LVL3 (GOWN DISPOSABLE) ×2 IMPLANT
GOWN STRL REUS W/TWL LRG LVL3 (GOWN DISPOSABLE) ×6
HEMOSTAT SNOW SURGICEL 2X4 (HEMOSTASIS) ×2 IMPLANT
HEMOSTAT SURGICEL 2X14 (HEMOSTASIS) IMPLANT
LINER CANISTER 1000CC FLEX (MISCELLANEOUS) ×1 IMPLANT
NS IRRIG 1000ML POUR BTL (IV SOLUTION) ×1 IMPLANT
PACK BASIN DAY SURGERY FS (CUSTOM PROCEDURE TRAY) ×2 IMPLANT
POUCH SPECIMEN RETRIEVAL 10MM (ENDOMECHANICALS) ×2 IMPLANT
SCISSORS LAP 5X35 DISP (ENDOMECHANICALS) IMPLANT
SET CHOLANGIOGRAPH 5 50 .035 (SET/KITS/TRAYS/PACK) ×2 IMPLANT
SET IRRIG TUBING LAPAROSCOPIC (IRRIGATION / IRRIGATOR) ×2 IMPLANT
SLEEVE ENDOPATH XCEL 5M (ENDOMECHANICALS) ×3 IMPLANT
SLEEVE SCD COMPRESS KNEE MED (MISCELLANEOUS) ×2 IMPLANT
SUT MNCRL AB 4-0 PS2 18 (SUTURE) ×2 IMPLANT
SUT VICRYL 0 UR6 27IN ABS (SUTURE) IMPLANT
TOWEL OR 17X24 6PK STRL BLUE (TOWEL DISPOSABLE) ×2 IMPLANT
TOWEL OR NON WOVEN STRL DISP B (DISPOSABLE) ×2 IMPLANT
TRAY LAPAROSCOPIC (CUSTOM PROCEDURE TRAY) ×2 IMPLANT
TROCAR XCEL BLUNT TIP 100MML (ENDOMECHANICALS) ×2 IMPLANT
TROCAR XCEL NON-BLD 11X100MML (ENDOMECHANICALS) ×2 IMPLANT
TROCAR XCEL NON-BLD 5MMX100MML (ENDOMECHANICALS) ×2 IMPLANT
TUBE CONNECTING 20X1/4 (TUBING) ×1 IMPLANT

## 2013-07-12 NOTE — Discharge Instructions (Signed)
[facility] ______[facility] °LAPAROSCOPIC SURGERY: POST OP INSTRUCTIONS °Always review your discharge instruction sheet given to you by the facility where your surgery was performed. °IF YOU HAVE DISABILITY OR FAMILY LEAVE FORMS, YOU MUST BRING THEM TO THE OFFICE FOR PROCESSING.   °DO NOT GIVE THEM TO YOUR DOCTOR. ° °1. A prescription for pain medication may be given to you upon discharge.  Take your pain medication as prescribed, if needed.  If narcotic pain medicine is not needed, then you may take acetaminophen (Tylenol) or ibuprofen (Advil) as needed. °2. Take your usually prescribed medications unless otherwise directed. °3. If you need a refill on your pain medication, please contact your pharmacy.  They will contact our office to request authorization. Prescriptions will not be filled after 5pm or on week-ends. °4. You should follow a light diet the first few days after arrival home, such as soup and crackers, etc.  Be sure to include lots of fluids daily. °5. Most patients will experience some swelling and bruising in the area of the incisions.  Ice packs will help.  Swelling and bruising can take several days to resolve.  °6. It is common to experience some constipation if taking pain medication after surgery.  Increasing fluid intake and taking a stool softener (such as Colace) will usually help or prevent this problem from occurring.  A mild laxative (Milk of Magnesia or Miralax) should be taken according to package instructions if there are no bowel movements after 48 hours. °7. Unless discharge instructions indicate otherwise, you may remove your bandages 24-48 hours after surgery, and you may shower at that time.  You may have steri-strips (small skin tapes) in place directly over the incision.  These strips should be left on the skin for 7-10 days.  If your surgeon used skin glue on the incision, you may shower in 24 hours.  The glue will flake off over the next 2-3 weeks.  Any sutures or  staples will be removed at the office during your follow-up visit. °8. ACTIVITIES:  You may resume regular (light) daily activities beginning the next day--such as daily self-care, walking, climbing stairs--gradually increasing activities as tolerated.  You may have sexual intercourse when it is comfortable.  Refrain from any heavy lifting or straining until approved by your doctor. °a. You may drive when you are no longer taking prescription pain medication, you can comfortably wear a seatbelt, and you can safely maneuver your car and apply brakes. °b. RETURN TO WORK:  __________________________________________________________ °9. You should see your doctor in the office for a follow-up appointment approximately 2-3 weeks after your surgery.  Make sure that you call for this appointment within a day or two after you arrive home to insure a convenient appointment time. °10. OTHER INSTRUCTIONS: __________________________________________________________________________________________________________________________ __________________________________________________________________________________________________________________________ °WHEN TO CALL YOUR DOCTOR: °1. Fever over 101.0 °2. Inability to urinate °3. Continued bleeding from incision. °4. Increased pain, redness, or drainage from the incision. °5. Increasing abdominal pain ° °The clinic staff is available to answer your questions during regular business hours.  Please don’t hesitate to call and ask to speak to one of the nurses for clinical concerns.  If you have a medical emergency, go to the nearest emergency room or call 911.  A surgeon from [facility] is always on call at the hospital. °[address], [address], [place], [place]  [address] ? [address] [address], [place], [place]   [address] °[phone] ? [phone] ? FAX [phone] °Web site:   [email or web address] ° ° °Post Anesthesia Home Care Instructions ° °Activity: °Get  plenty of rest for the remainder of the day. A responsible adult should stay with you for 24 hours following the procedure.  °For the next 24 hours, DO NOT: °-Drive a car °-Operate machinery °-Drink alcoholic beverages °-Take any medication unless instructed by your physician °-Make any legal decisions or sign important papers. ° °Meals: °Start with liquid foods such as gelatin or soup. Progress to regular foods as tolerated. Avoid greasy, spicy, heavy foods. If nausea and/or vomiting occur, drink only clear liquids until the nausea and/or vomiting subsides. Call your physician if vomiting continues. ° °Special Instructions/Symptoms: °Your throat may feel dry or sore from the anesthesia or the breathing tube placed in your throat during surgery. If this causes discomfort, gargle with warm salt water. The discomfort should disappear within 24 hours. ° °

## 2013-07-12 NOTE — Anesthesia Postprocedure Evaluation (Signed)
  Anesthesia Post-op Note  Patient: Tracy [name]  Procedure(s) Performed: Procedure(s): LAPAROSCOPIC CHOLECYSTECTOMY WITH INTRAOPERATIVE CHOLANGIOGRAM (N/A)  Patient Location: PACU  Anesthesia Type:General  Level of Consciousness: awake and alert   Airway and Oxygen Therapy: Patient Spontanous Breathing  Post-op Pain: mild  Post-op Assessment: Post-op Vital signs reviewed, Patient's Cardiovascular Status Stable and Respiratory Function Stable  Post-op Vital Signs: Reviewed  Filed Vitals:   07/12/13 1400  BP: 126/73  Pulse: 64  Temp:   Resp: 14    Complications: No apparent anesthesia complications

## 2013-07-12 NOTE — Op Note (Signed)
Laparoscopic Cholecystectomy with IOC Procedure Note  Indications: This patient presents with symptomatic gallbladder disease and will undergo laparoscopic cholecystectomy.The procedure has been discussed with the patient. Operative and non operative treatments have been discussed. Risks of surgery include bleeding, infection,  Common bile duct injury,  Injury to the stomach,liver, colon,small intestine, abdominal wall,  Diaphragm,  Major blood vessels,  And the need for an open procedure.  Other risks include worsening of medical problems, death,  DVT and pulmonary embolism, and cardiovascular events.   Medical options have also been discussed. The patient has been informed of long term expectations of surgery and non surgical options,  The patient agrees to proceed.    Pre-operative Diagnosis: Calculus of gallbladder without mention of cholecystitis or obstruction  Post-operative Diagnosis: Same  Surgeon: Gloriana Piltz A.   Assistants: OR   Anesthesia: General endotracheal anesthesia and LOCAL 0.25% Marcaine with epinephrine  ASA Class: 2  Procedure Details  The patient was seen again in the Holding Room. The risks, benefits, complications, treatment options, and expected outcomes were discussed with the patient. The possibilities of reaction to medication, pulmonary aspiration, perforation of viscus, bleeding, recurrent infection, finding a normal gallbladder, the need for additional procedures, failure to diagnose a condition, the possible need to convert to an open procedure, and creating a complication requiring transfusion or operation were discussed with the patient. The patient and/or family concurred with the proposed plan, giving informed consent. The site of surgery properly noted/marked. The patient was taken to Operating Room, identified as Sammuel HinesKelly J Gunnarson and the procedure verified as Laparoscopic Cholecystectomy with Intraoperative Cholangiograms. A Time Out was held and the above  information confirmed.  Prior to the induction of general anesthesia, antibiotic prophylaxis was administered. General endotracheal anesthesia was then administered and tolerated well. After the induction, the abdomen was prepped in the usual sterile fashion. The patient was positioned in the supine position with the left arm comfortably tucked, along with some reverse Trendelenburg.  Local anesthetic agent was injected into the skin near the umbilicus and an incision made. The midline fascia was incised and the Hasson technique was used to introduce a 12 mm port under direct vision. It was secured with a figure of eight Vicryl suture placed in the usual fashion. Pneumoperitoneum was then created with CO2 and tolerated well without any adverse changes in the patient's vital signs. Additional trocars were introduced under direct vision with an 11 mm trocar in the epigastrium and two  5 mm trocars in the right upper quadrant. All skin incisions were infiltrated with a local anesthetic agent before making the incision and placing the trocars.   The gallbladder was identified, the fundus grasped and retracted cephalad. Adhesions were lysed bluntly and with the electrocautery where indicated, taking care not to injure any adjacent organs or viscus. The infundibulum was grasped and retracted laterally, exposing the peritoneum overlying the triangle of Calot. This was then divided and exposed in a blunt fashion. The cystic duct was clearly identified and bluntly dissected circumferentially. The junctions of the gallbladder, cystic duct and common bile duct were clearly identified prior to the division of any linear structure.   An incision was made in the cystic duct and the cholangiogram catheter introduced. The catheter was secured using an endoclip. The study showed no stones and good visualization of the distal and proximal biliary tree. The catheter was then removed.   The cystic duct was then  ligated with  surgical clips  on the patient side and  clipped on the gallbladder side and divided. The cystic artery was identified, dissected free, ligated with clips and divided as well. Posterior cystic artery clipped and divided.  The gallbladder was dissected from the liver bed in retrograde fashion with the electrocautery. The gallbladder was removed with bag. . The liver bed was irrigated and inspected. Hemostasis was achieved with the electrocautery. Copious irrigation was utilized and was repeatedly aspirated until clear all particulate matter. Hemostasis was achieved with no signs  Of bleeding or bile leakage.Surgicel snow placed.   Pneumoperitoneum was completely reduced after viewing removal of the trocars under direct vision. The wound was thoroughly irrigated and the fascia was then closed with a figure of eight suture; the skin was then closed with 4 0 monocryl  and a sterile dressing was applied.  Instrument, sponge, and needle counts were correct at closure and at the conclusion of the case.   Findings:  Cholelithiasis  Estimated Blood Loss: less than 100 mL         Drains: none         Total IV Fluids: 600 mL         Specimens: Gallbladder           Complications: None; patient tolerated the procedure well.         Disposition: PACU - hemodynamically stable.         Condition: stable

## 2013-07-12 NOTE — Anesthesia Preprocedure Evaluation (Addendum)
Anesthesia Evaluation  Patient identified by MRN, date of birth, ID band Patient awake    Reviewed: Allergy & Precautions, H&P , NPO status , Patient's Chart, lab work & pertinent test results  History of Anesthesia Complications (+) PONV  Airway Mallampati: II TM Distance: >3 FB Neck ROM: Full    Dental no notable dental hx. (+) Upper Dentures, Dental Advisory Given   Pulmonary Current Smoker,  breath sounds clear to auscultation  Pulmonary exam normal       Cardiovascular negative cardio ROS  Rhythm:Regular Rate:Normal     Neuro/Psych negative neurological ROS  negative psych ROS   GI/Hepatic Neg liver ROS, GERD-  Medicated and Controlled,  Endo/Other  negative endocrine ROS  Renal/GU negative Renal ROS  negative genitourinary   Musculoskeletal   Abdominal   Peds  Hematology negative hematology ROS (+)   Anesthesia Other Findings   Reproductive/Obstetrics negative OB ROS                          Anesthesia Physical Anesthesia Plan  ASA: II  Anesthesia Plan: General   Post-op Pain Management:    Induction: Intravenous  Airway Management Planned: Oral ETT  Additional Equipment:   Intra-op Plan:   Post-operative Plan: Extubation in OR  Informed Consent: I have reviewed the patients History and Physical, chart, labs and discussed the procedure including the risks, benefits and alternatives for the proposed anesthesia with the patient or authorized representative who has indicated his/her understanding and acceptance.   Dental advisory given  Plan Discussed with: CRNA  Anesthesia Plan Comments:         Anesthesia Quick Evaluation

## 2013-07-12 NOTE — Anesthesia Procedure Notes (Signed)
Procedure Name: Intubation Date/Time: 07/12/2013 11:04 AM Performed by: Gar GibbonKEETON, Tracy Scheffel S Pre-anesthesia Checklist: Patient identified, Emergency Drugs available, Suction available and Patient being monitored Patient Re-evaluated:Patient Re-evaluated prior to inductionOxygen Delivery Method: Circle System Utilized Preoxygenation: Pre-oxygenation with 100% oxygen Intubation Type: IV induction Ventilation: Mask ventilation without difficulty Laryngoscope Size: Mac and 3 Grade View: Grade II Tube type: Oral Tube size: 7.0 mm Number of attempts: 1 Airway Equipment and Method: stylet and oral airway Placement Confirmation: ETT inserted through vocal cords under direct vision,  positive ETCO2 and breath sounds checked- equal and bilateral Secured at: 19 cm Tube secured with: Tape Dental Injury: Teeth and Oropharynx as per pre-operative assessment

## 2013-07-12 NOTE — Transfer of Care (Signed)
Immediate Anesthesia Transfer of Care Note  Patient: Tracy [name]  Procedure(s) Performed: Procedure(s): LAPAROSCOPIC CHOLECYSTECTOMY WITH INTRAOPERATIVE CHOLANGIOGRAM (N/A)  Patient Location: PACU  Anesthesia Type:General  Level of Consciousness: awake, sedated, patient cooperative and confused  Airway & Oxygen Therapy: Patient Spontanous Breathing and Patient connected to face mask oxygen  Post-op Assessment: Report given to PACU RN and Post -op Vital signs reviewed and stable  Post vital signs: Reviewed and stable  Complications: No apparent anesthesia complications

## 2013-07-12 NOTE — H&P (View-Only) (Signed)
Patient ID: Tracy [name], female   DOB: 1971-03-11, 43 y.o.   MRN: [id number]  No chief complaint on file.   HPI Tracy HinesKelly J Traughber is a 43 y.o. female.  Patient sent at request of Dr. Dorinda Hillonald More for right upper quadrant abdominal pain. This is been going on for a number of months. The pain comes and goes. Location is right upper quadrant of abdomen. There is no radiation of pain. It happens every few weeks or so. It lasts for a few hours and goes away on its. Made worse by fatty food. Ultrasound shows gallstones mildly thickened gallbladder wall. HPI  Past Medical History  Diagnosis Date  . Hyperlipidemia     Past Surgical History  Procedure Laterality Date  . Cesarean section      Two    Family History  Problem Relation Age of Onset  . COPD Mother   . Heart disease Mother   . Heart disease Father   . Diabetes Father   . Heart disease Brother   . Breast cancer      aunt  . Colon cancer      great uncle  . Lung cancer      uncle  . Esophageal cancer      grandfather    Social History History  Substance Use Topics  . Smoking status: Current Every Day Smoker  . Smokeless tobacco: Not on file  . Alcohol Use: No    Allergies  Allergen Reactions  . Codeine     Current Outpatient Prescriptions  Medication Sig Dispense Refill  . atorvastatin (LIPITOR) 20 MG tablet Take 1 tablet (20 mg total) by mouth daily.  30 tablet  3  . Digestive Enzymes (ACIDOLL PO) Take by mouth.      . FIBER PO Take by mouth.      . Omega-3 Fatty Acids (FISH OIL PO) Take by mouth.      Marland Kitchen. omeprazole (PRILOSEC) 20 MG capsule Take 20 mg by mouth daily.      . Probiotic Product (PROBIOTIC DAILY PO) Take by mouth.      . Red Yeast Rice Extract (RED YEAST RICE PO) Take by mouth.       No current facility-administered medications for this visit.    Review of Systems Review of Systems  Constitutional: Negative for fever, chills and unexpected weight change.  HENT: Negative for congestion,  hearing loss, sore throat, trouble swallowing and voice change.   Eyes: Negative for visual disturbance.  Respiratory: Negative for cough and wheezing.   Cardiovascular: Negative for chest pain, palpitations and leg swelling.  Gastrointestinal: Positive for abdominal pain. Negative for nausea, vomiting, diarrhea, constipation, blood in stool, abdominal distention and anal bleeding.  Genitourinary: Negative for hematuria, vaginal bleeding and difficulty urinating.  Musculoskeletal: Negative for arthralgias.  Skin: Negative for rash and wound.  Neurological: Negative for seizures, syncope and headaches.  Hematological: Negative for adenopathy. Does not bruise/bleed easily.  Psychiatric/Behavioral: Negative for confusion.    Blood pressure 112/74, pulse 86, resp. rate 14, height 4' 11.7" (1.516 m), weight 185 lb 3.2 oz (84.006 kg).  Physical Exam Physical Exam  Constitutional: She is oriented to person, place, and time. She appears well-developed and well-nourished.  HENT:  Head: Normocephalic and atraumatic.  Eyes: Pupils are equal, round, and reactive to light. No scleral icterus.  Neck: Normal range of motion. Neck supple.  Cardiovascular: Normal rate and regular rhythm.   Pulmonary/Chest: Effort normal and breath sounds normal.  Abdominal: Soft.  Bowel sounds are normal. She exhibits no distension. There is no tenderness.  Musculoskeletal: Normal range of motion.  Neurological: She is alert and oriented to person, place, and time.  Skin: Skin is warm and dry.  Psychiatric: She has a normal mood and affect. Her behavior is normal. Judgment and thought content normal.    Data Reviewed U/S gallstones with mildly thicken GB wall.  CBD 5 mm.   Assessment    Symptomatic cholelithiasis PONV    Plan    Recommend laparoscopic cholecystectomy and cholangiogram.The procedure has been discussed with the patient. Operative and non operative treatments have been discussed. Risks of  surgery include bleeding, infection,  Common bile duct injury,  Injury to the stomach,liver, colon,small intestine, abdominal wall,  Diaphragm,  Major blood vessels,  And the need for an open procedure.  Other risks include worsening of medical problems, death,  DVT and pulmonary embolism, and cardiovascular events.   Medical options have also been discussed. The patient has been informed of long term expectations of surgery and non surgical options,  The patient agrees to proceed.         Pina Sirianni A. 06/16/2013, 9:37 AM

## 2013-07-12 NOTE — Interval H&P Note (Signed)
History and Physical Interval Note:  07/12/2013 10:45 AM  Tracy [name]  has presented today for surgery, with the diagnosis of gallstones  The various methods of treatment have been discussed with the patient and family. After consideration of risks, benefits and other options for treatment, the patient has consented to  Procedure(s): LAPAROSCOPIC CHOLECYSTECTOMY WITH INTRAOPERATIVE CHOLANGIOGRAM (N/A) as a surgical intervention .  The patient's history has been reviewed, patient examined, no change in status, stable for surgery.  I have reviewed the patient's chart and labs.  Questions were answered to the patient's satisfaction.     [name]

## 2013-07-17 ENCOUNTER — Encounter (HOSPITAL_BASED_OUTPATIENT_CLINIC_OR_DEPARTMENT_OTHER): Payer: Self-pay | Admitting: Surgery

## 2013-07-17 NOTE — Addendum Note (Signed)
Addendum created 07/17/13 0640 by Lance CoonWesley Burleigh Brockmann, CRNA   Modules edited: Anesthesia Responsible Staff

## 2013-07-28 ENCOUNTER — Encounter (INDEPENDENT_AMBULATORY_CARE_PROVIDER_SITE_OTHER): Payer: Self-pay | Admitting: Surgery

## 2013-07-28 ENCOUNTER — Ambulatory Visit (INDEPENDENT_AMBULATORY_CARE_PROVIDER_SITE_OTHER): Payer: 59 | Admitting: Surgery

## 2013-07-28 VITALS — BP 110/75 | HR 95 | Temp 98.3°F | Resp 16 | Ht 59.75 in | Wt 179.8 lb

## 2013-07-28 DIAGNOSIS — Z9889 Other specified postprocedural states: Secondary | ICD-10-CM

## 2013-07-28 NOTE — Patient Instructions (Signed)
Resume full activity. Return as needed.  

## 2013-07-28 NOTE — Progress Notes (Signed)
she is here for a postop visit following laparoscopic cholecystectomy.  Diet is being tolerated, bowels are moving.  No problems with incisions.  PE:  ABD:  Soft, incisions clean/dry/intact and solid.  Assessment:  Doing well postop.  Plan:  Lowfat diet recommended.  Activities as tolerated.  Return visit prn. 

## 2014-02-09 ENCOUNTER — Ambulatory Visit: Payer: 59 | Admitting: Nurse Practitioner

## 2014-06-23 ENCOUNTER — Ambulatory Visit (INDEPENDENT_AMBULATORY_CARE_PROVIDER_SITE_OTHER): Payer: 59 | Admitting: Nurse Practitioner

## 2014-06-23 ENCOUNTER — Encounter: Payer: Self-pay | Admitting: Nurse Practitioner

## 2014-06-23 VITALS — BP 117/81 | HR 97 | Temp 97.9°F | Ht 59.75 in | Wt 182.2 lb

## 2014-06-23 DIAGNOSIS — M545 Low back pain, unspecified: Secondary | ICD-10-CM

## 2014-06-23 DIAGNOSIS — N762 Acute vulvitis: Secondary | ICD-10-CM | POA: Diagnosis not present

## 2014-06-23 MED ORDER — NAPROXEN 500 MG PO TABS: 500.0000 mg | ORAL_TABLET | Freq: Two times a day (BID) | ORAL | Status: DC

## 2014-06-23 MED ORDER — CYCLOBENZAPRINE HCL 5 MG PO TABS: 5.0000 mg | ORAL_TABLET | Freq: Three times a day (TID) | ORAL | Status: DC | PRN

## 2014-06-23 MED ORDER — SULFAMETHOXAZOLE-TRIMETHOPRIM 800-160 MG PO TABS: 1.0000 | ORAL_TABLET | Freq: Two times a day (BID) | ORAL | Status: DC

## 2014-06-23 NOTE — Progress Notes (Signed)
   Subjective:    Patient ID: Tracy [name], female    DOB: 02/26/1971, 44 y.o.   MRN: [id number]  HPI Patient in today c/o a sore place left groin area- has been there for about 3 days- painful and swollen. Also says that she bent over to pick up her shoes and felt immediate back pain- pain still there- stabbing rated 8/10- does not radiate.    Review of Systems  Constitutional: Negative.   HENT: Negative.   Respiratory: Negative.   Cardiovascular: Negative.   Genitourinary: Negative.   Neurological: Negative.   Psychiatric/Behavioral: Negative.   All other systems reviewed and are negative.      Objective:   Physical Exam  Constitutional: She is oriented to person, place, and time. She appears well-developed and well-nourished.  Cardiovascular: Normal rate and normal heart sounds.   Pulmonary/Chest: Effort normal and breath sounds normal.  Genitourinary:  3cm indurated lesion left labia majoria- no drainage  Musculoskeletal:  FROM of lumbar spine with pain on rotation (-) slr bil Motor strength and sensation distally intact  Neurological: She is alert and oriented to person, place, and time. She has normal reflexes.  Skin: Skin is warm and dry.  Psychiatric: She has a normal mood and affect. Her behavior is normal. Judgment and thought content normal.   BP 117/81 mmHg  Pulse 97  Temp(Src) 97.9 F (36.6 C) (Oral)  Ht 4' 11.75" (1.518 m)  Wt 182 lb 3.2 oz (82.645 kg)  BMI 35.87 kg/m2  LMP 06/23/2014        Assessment & Plan:  1. Cellulitis of labia majora Warm soaks Ok to squeeze if starts draining rto if worsening - sulfamethoxazole-trimethoprim (BACTRIM DS) 800-160 MG per tablet; Take 1 tablet by mouth 2 (two) times daily.  Dispense: 14 tablet; Refill: 0  2. Midline low back pain without sciatica Moist heat Rest RTO prn - cyclobenzaprine (FLEXERIL) 5 MG tablet; Take 1 tablet (5 mg total) by mouth 3 (three) times daily as needed for muscle spasms.   Dispense: 30 tablet; Refill: 1 - naproxen (NAPROSYN) 500 MG tablet; Take 1 tablet (500 mg total) by mouth 2 (two) times daily with a meal.  Dispense: 60 tablet; Refill: 1  Mary-Margaret Daphine DeutscherMartin, FNP

## 2014-06-23 NOTE — Patient Instructions (Signed)

## 2014-06-27 ENCOUNTER — Telehealth: Payer: Self-pay | Admitting: *Deleted

## 2014-06-27 ENCOUNTER — Telehealth: Payer: Self-pay | Admitting: Nurse Practitioner

## 2014-06-27 NOTE — Telephone Encounter (Signed)
Pt called stating she started taking the Bactrim DS and her face got red and started getting puffy, didn't take yesterday and face not as puffy, has red dots on her abdomen. Advised pt to not take anymore of the bactrim. Pt wants to know if you want to try another antbx? Please advise.

## 2014-06-27 NOTE — Telephone Encounter (Signed)
Give call to nurse to r/o allergic rx

## 2014-06-28 MED ORDER — CEPHALEXIN 500 MG PO CAPS: 500.0000 mg | ORAL_CAPSULE | Freq: Three times a day (TID) | ORAL | Status: DC

## 2014-06-28 NOTE — Telephone Encounter (Signed)
Left detailed msg stating antbx changed and sent to pharmacy to [facility] with any questions or concerns.

## 2014-06-28 NOTE — Telephone Encounter (Signed)
Changed antibitic to keflex- rx sent to pharmacy

## 2016-01-30 ENCOUNTER — Encounter: Payer: Self-pay | Admitting: Pediatrics

## 2016-01-30 ENCOUNTER — Ambulatory Visit (INDEPENDENT_AMBULATORY_CARE_PROVIDER_SITE_OTHER): Payer: BLUE CROSS/BLUE SHIELD | Admitting: Pediatrics

## 2016-01-30 VITALS — BP 129/89 | HR 106 | Temp 98.3°F | Ht 59.75 in | Wt 193.0 lb

## 2016-01-30 DIAGNOSIS — F17200 Nicotine dependence, unspecified, uncomplicated: Secondary | ICD-10-CM | POA: Insufficient documentation

## 2016-01-30 DIAGNOSIS — Z0000 Encounter for general adult medical examination without abnormal findings: Secondary | ICD-10-CM | POA: Diagnosis not present

## 2016-01-30 DIAGNOSIS — F331 Major depressive disorder, recurrent, moderate: Secondary | ICD-10-CM | POA: Insufficient documentation

## 2016-01-30 DIAGNOSIS — Z716 Tobacco abuse counseling: Secondary | ICD-10-CM

## 2016-01-30 DIAGNOSIS — K219 Gastro-esophageal reflux disease without esophagitis: Secondary | ICD-10-CM | POA: Insufficient documentation

## 2016-01-30 DIAGNOSIS — F4322 Adjustment disorder with anxiety: Secondary | ICD-10-CM

## 2016-01-30 DIAGNOSIS — Z6838 Body mass index (BMI) 38.0-38.9, adult: Secondary | ICD-10-CM | POA: Insufficient documentation

## 2016-01-30 LAB — BAYER DCA HB A1C WAIVED: HB A1C (BAYER DCA - WAIVED): 5.3 % (ref <7.0)

## 2016-01-30 MED ORDER — OMEPRAZOLE 20 MG PO CPDR: 20.0000 mg | DELAYED_RELEASE_CAPSULE | Freq: Every day | ORAL | 3 refills | Status: DC

## 2016-01-30 MED ORDER — CITALOPRAM HYDROBROMIDE 10 MG PO TABS: 10.0000 mg | ORAL_TABLET | Freq: Every day | ORAL | 1 refills | Status: DC

## 2016-01-30 NOTE — Patient Instructions (Signed)
[facility] Appointment: [phone]  

## 2016-01-30 NOTE — Progress Notes (Signed)
Subjective:   Patient ID: Tracy [name], female    DOB: [birth year], 44 y.o.   MRN: [id number] CC: annual Exam  HPI: Tracy [name] is a 45 y.o. female presenting for annual Exam  LMP: 2 weeks ago Missed periods for 6 months prior to that Had 2 weeks of bleeding   Stress Ongoing at home, at work Has 43yo, 43yo at home Feels safe at home Snapping at kids at times No time for counseling Not able to get regular exercise in Drinking multiple cups of sugary beverages a day  Tobacco use; 1ppd smokes inside some Husband smokes some, not as much as she does Has been on wellbutrin in the past for smoking cessation, didn't help and made her angry/furious  GER: symptoms well controlled as long as she takes the medication  Elevated BMI: Drinking lots of sugar with caffeine Rarely exercising Sedentary job  Pap smear: last 3 yrs ago, neg When asked about STI screening, says to go ahead with testing Mammograms: started at 42yo Aunts and great aunts with breast ca  [place] uncle with colon ca  Depression screen [facility] 2/9 01/30/2016  Decreased Interest 0  Down, Depressed, Hopeless 0  PHQ - 2 Score 0    Relevant past medical, surgical, family and social history reviewed. Allergies and medications reviewed and updated. History  Smoking Status  . Current Every Day Smoker  . Packs/day: 1.00  Smokeless Tobacco  . Never Used   ROS: All systems negative other than what is in HPI  Objective:    BP 129/89   Pulse (!) 106   Temp 98.3 F (36.8 C) (Oral)   Ht 4' 11.75" (1.518 m)   Wt 193 lb (87.5 kg)   BMI 38.01 kg/m   Wt Readings from Last 3 Encounters:  01/30/16 193 lb (87.5 kg)  06/23/14 182 lb 3.2 oz (82.6 kg)  07/28/13 179 lb 12.8 oz (81.6 kg)    Gen: NAD, alert, cooperative with exam, NCAT EYES: EOMI, no conjunctival injection, or no icterus ENT:  TMs pearly gray b/l, OP without erythema LYMPH: no cervical LAD CV: NRRR, normal S1/S2, no murmur, distal pulses 2+  b/l Resp: CTABL, no wheezes, normal WOB Abd: +BS, soft, NTND. no guarding or organomegaly Ext: No edema, warm Neuro: Alert and oriented, strength equal b/l UE and LE, coordination grossly normal MSK: normal muscle bulk GU: normal external female genitalia, normal appearing cervix, scant white discharge cervical vault  Assessment & Plan:  Tracy [name] was seen today for gynecologic and annual exam.  Diagnoses and all orders for this visit:  Encounter for preventive health examination -     CMP14+EGFR -     Bayer DCA Hb A1c Waived -     Lipid panel -     TSH -     Pap IG, CT/NG NAA, and HPV (high risk) [facility] -     HIV antibody -     RPR  Gastroesophageal reflux disease, esophagitis presence not specified Symptoms well controlled with below -     omeprazole (PRILOSEC) 20 MG capsule; Take 1 capsule (20 mg total) by mouth daily.  Adjustment disorder with anxious mood Pt interested in starting medication for increased stress at home, has been ongoing for some time Discussed counseling, regular exercise Gave printed Rx, pt not sure if she wants to start or not wants to research medication first F/u in 8 weeks if she does start it -     citalopram (CELEXA) 10 MG tablet;  Take 1 tablet (10 mg total) by mouth daily.  BMI 38.0-38.9,adult Discussed lifestyle changes, decreasing sugar, regular walking -     CMP14+EGFR -     Bayer DCA Hb A1c Waived -     Lipid panel -     TSH  Tobacco abuse counseling Discussed cessation strategies Pt interested but says not yet ready  Follow up plan: 6 mo, sooner if needed Assunta Found, MD [place]

## 2016-01-31 LAB — CMP14+EGFR
ALT: 19 IU/L (ref 0–32)
AST: 18 IU/L (ref 0–40)
Albumin/Globulin Ratio: 1.7 (ref 1.2–2.2)
Albumin: 4.7 g/dL (ref 3.5–5.5)
Alkaline Phosphatase: 67 IU/L (ref 39–117)
BUN/Creatinine Ratio: 6 — ABNORMAL LOW (ref 9–23)
BUN: 5 mg/dL — ABNORMAL LOW (ref 6–24)
Bilirubin Total: <0.2 mg/dL (ref 0.0–1.2)
CO2: 26 mmol/L (ref 18–29)
Calcium: 9.7 mg/dL (ref 8.7–10.2)
Chloride: 97 mmol/L (ref 96–106)
Creatinine, Ser: 0.88 mg/dL (ref 0.57–1.00)
GFR calc Af Amer: 93 mL/min/{1.73_m2} (ref >59)
GFR calc non Af Amer: 81 mL/min/{1.73_m2} (ref >59)
Globulin, Total: 2.8 g/dL (ref 1.5–4.5)
Glucose: 94 mg/dL (ref 65–99)
Potassium: 4.2 mmol/L (ref 3.5–5.2)
Sodium: 141 mmol/L (ref 134–144)
Total Protein: 7.5 g/dL (ref 6.0–8.5)

## 2016-01-31 LAB — SPECIMEN STATUS

## 2016-01-31 LAB — HIV ANTIBODY (ROUTINE TESTING W REFLEX): HIV Screen 4th Generation wRfx: NONREACTIVE

## 2016-01-31 LAB — LIPID PANEL
Chol/HDL Ratio: 5.5 ratio units — ABNORMAL HIGH (ref 0.0–4.4)
Cholesterol, Total: 233 mg/dL — ABNORMAL HIGH (ref 100–199)
HDL: 42 mg/dL (ref >39)
LDL Calculated: 153 mg/dL — ABNORMAL HIGH (ref 0–99)
Triglycerides: 189 mg/dL — ABNORMAL HIGH (ref 0–149)
VLDL Cholesterol Cal: 38 mg/dL (ref 5–40)

## 2016-01-31 LAB — RPR: RPR Ser Ql: NONREACTIVE

## 2016-01-31 LAB — TSH: TSH: 0.763 u[IU]/mL (ref 0.450–4.500)

## 2016-02-05 LAB — PAP IG, CT-NG NAA, HPV HIGH-RISK
Chlamydia, Nuc. Acid Amp: NEGATIVE
Gonococcus by Nucleic Acid Amp: NEGATIVE
HPV, high-risk: NEGATIVE
PAP Smear Comment: 0

## 2016-02-07 ENCOUNTER — Observation Stay (HOSPITAL_COMMUNITY): Payer: BLUE CROSS/BLUE SHIELD | Admitting: Anesthesiology

## 2016-02-07 ENCOUNTER — Encounter (HOSPITAL_COMMUNITY): Admission: EM | Disposition: A | Discharge status: Home / Self Care | Payer: Self-pay | Source: Home / Self Care

## 2016-02-07 ENCOUNTER — Ambulatory Visit (INDEPENDENT_AMBULATORY_CARE_PROVIDER_SITE_OTHER): Payer: BLUE CROSS/BLUE SHIELD | Admitting: Physician Assistant

## 2016-02-07 ENCOUNTER — Inpatient Hospital Stay (HOSPITAL_COMMUNITY)
Admission: EM | Admit: 2016-02-07 | Discharge: 2016-02-12 | DRG: 339 | Disposition: A | Discharge status: Home / Self Care | Payer: BLUE CROSS/BLUE SHIELD | Attending: Surgery | Admitting: Surgery

## 2016-02-07 ENCOUNTER — Encounter: Payer: Self-pay | Admitting: Physician Assistant

## 2016-02-07 ENCOUNTER — Emergency Department (HOSPITAL_COMMUNITY): Payer: BLUE CROSS/BLUE SHIELD

## 2016-02-07 ENCOUNTER — Encounter (HOSPITAL_COMMUNITY): Payer: Self-pay

## 2016-02-07 VITALS — BP 114/73 | HR 128 | Temp 97.0°F | Ht 59.75 in | Wt 188.2 lb

## 2016-02-07 DIAGNOSIS — F4322 Adjustment disorder with anxiety: Secondary | ICD-10-CM | POA: Diagnosis present

## 2016-02-07 DIAGNOSIS — R1031 Right lower quadrant pain: Secondary | ICD-10-CM

## 2016-02-07 DIAGNOSIS — F17210 Nicotine dependence, cigarettes, uncomplicated: Secondary | ICD-10-CM | POA: Diagnosis present

## 2016-02-07 DIAGNOSIS — E785 Hyperlipidemia, unspecified: Secondary | ICD-10-CM | POA: Diagnosis present

## 2016-02-07 DIAGNOSIS — R5081 Fever presenting with conditions classified elsewhere: Secondary | ICD-10-CM | POA: Diagnosis not present

## 2016-02-07 DIAGNOSIS — N390 Urinary tract infection, site not specified: Secondary | ICD-10-CM | POA: Diagnosis present

## 2016-02-07 DIAGNOSIS — Z6838 Body mass index (BMI) 38.0-38.9, adult: Secondary | ICD-10-CM

## 2016-02-07 DIAGNOSIS — F419 Anxiety disorder, unspecified: Secondary | ICD-10-CM | POA: Diagnosis present

## 2016-02-07 DIAGNOSIS — Z8249 Family history of ischemic heart disease and other diseases of the circulatory system: Secondary | ICD-10-CM | POA: Diagnosis not present

## 2016-02-07 DIAGNOSIS — F17200 Nicotine dependence, unspecified, uncomplicated: Secondary | ICD-10-CM

## 2016-02-07 DIAGNOSIS — Z825 Family history of asthma and other chronic lower respiratory diseases: Secondary | ICD-10-CM

## 2016-02-07 DIAGNOSIS — K219 Gastro-esophageal reflux disease without esophagitis: Secondary | ICD-10-CM | POA: Diagnosis present

## 2016-02-07 DIAGNOSIS — Z885 Allergy status to narcotic agent status: Secondary | ICD-10-CM

## 2016-02-07 DIAGNOSIS — M5136 Other intervertebral disc degeneration, lumbar region: Secondary | ICD-10-CM | POA: Diagnosis present

## 2016-02-07 DIAGNOSIS — K3532 Acute appendicitis with perforation and localized peritonitis, without abscess: Secondary | ICD-10-CM | POA: Diagnosis present

## 2016-02-07 DIAGNOSIS — K353 Acute appendicitis with localized peritonitis: Secondary | ICD-10-CM | POA: Diagnosis present

## 2016-02-07 DIAGNOSIS — F331 Major depressive disorder, recurrent, moderate: Secondary | ICD-10-CM | POA: Diagnosis present

## 2016-02-07 DIAGNOSIS — E669 Obesity, unspecified: Secondary | ICD-10-CM | POA: Diagnosis present

## 2016-02-07 DIAGNOSIS — R197 Diarrhea, unspecified: Secondary | ICD-10-CM | POA: Diagnosis not present

## 2016-02-07 DIAGNOSIS — Z882 Allergy status to sulfonamides status: Secondary | ICD-10-CM | POA: Diagnosis not present

## 2016-02-07 DIAGNOSIS — E876 Hypokalemia: Secondary | ICD-10-CM | POA: Diagnosis not present

## 2016-02-07 DIAGNOSIS — Z79899 Other long term (current) drug therapy: Secondary | ICD-10-CM

## 2016-02-07 DIAGNOSIS — B9620 Unspecified Escherichia coli [E. coli] as the cause of diseases classified elsewhere: Secondary | ICD-10-CM | POA: Diagnosis present

## 2016-02-07 DIAGNOSIS — N3000 Acute cystitis without hematuria: Secondary | ICD-10-CM

## 2016-02-07 DIAGNOSIS — K3580 Unspecified acute appendicitis: Secondary | ICD-10-CM | POA: Diagnosis present

## 2016-02-07 DIAGNOSIS — Z6837 Body mass index (BMI) 37.0-37.9, adult: Secondary | ICD-10-CM

## 2016-02-07 DIAGNOSIS — Z716 Tobacco abuse counseling: Secondary | ICD-10-CM

## 2016-02-07 HISTORY — PX: LAPAROSCOPIC APPENDECTOMY: SHX408

## 2016-02-07 LAB — COMPREHENSIVE METABOLIC PANEL
ALT: 18 U/L (ref 14–54)
AST: 18 U/L (ref 15–41)
Albumin: 3.8 g/dL (ref 3.5–5.0)
Alkaline Phosphatase: 54 U/L (ref 38–126)
Anion gap: 12 (ref 5–15)
BUN: 5 mg/dL — ABNORMAL LOW (ref 6–20)
CO2: 24 mmol/L (ref 22–32)
Calcium: 9.1 mg/dL (ref 8.9–10.3)
Chloride: 100 mmol/L — ABNORMAL LOW (ref 101–111)
Creatinine, Ser: 0.85 mg/dL (ref 0.44–1.00)
GFR calc Af Amer: >60 mL/min (ref >60)
GFR calc non Af Amer: >60 mL/min (ref >60)
Glucose, Bld: 130 mg/dL — ABNORMAL HIGH (ref 65–99)
Potassium: 3.5 mmol/L (ref 3.5–5.1)
Sodium: 136 mmol/L (ref 135–145)
Total Bilirubin: 1 mg/dL (ref 0.3–1.2)
Total Protein: 7.1 g/dL (ref 6.5–8.1)

## 2016-02-07 LAB — URINE MICROSCOPIC-ADD ON

## 2016-02-07 LAB — URINALYSIS, ROUTINE W REFLEX MICROSCOPIC
Glucose, UA: NEGATIVE mg/dL
Ketones, ur: >80 mg/dL — AB
Nitrite: POSITIVE — AB
Protein, ur: 100 mg/dL — AB
Specific Gravity, Urine: 1.021 (ref 1.005–1.030)
pH: 6 (ref 5.0–8.0)

## 2016-02-07 LAB — CBC
HCT: 42.5 % (ref 36.0–46.0)
Hemoglobin: 14.1 g/dL (ref 12.0–15.0)
MCH: 27.7 pg (ref 26.0–34.0)
MCHC: 33.2 g/dL (ref 30.0–36.0)
MCV: 83.5 fL (ref 78.0–100.0)
Platelets: 318 10*3/uL (ref 150–400)
RBC: 5.09 MIL/uL (ref 3.87–5.11)
RDW: 13.9 % (ref 11.5–15.5)
WBC: 25.3 10*3/uL — ABNORMAL HIGH (ref 4.0–10.5)

## 2016-02-07 LAB — LIPASE, BLOOD: Lipase: 20 U/L (ref 11–51)

## 2016-02-07 LAB — POC URINE PREG, ED: Preg Test, Ur: NEGATIVE

## 2016-02-07 SURGERY — APPENDECTOMY, LAPAROSCOPIC
Anesthesia: General | Site: Abdomen

## 2016-02-07 MED ORDER — DIPHENHYDRAMINE HCL 12.5 MG/5ML PO ELIX: 12.5000 mg | ORAL_SOLUTION | Freq: Four times a day (QID) | ORAL | Status: DC | PRN

## 2016-02-07 MED ORDER — FENTANYL CITRATE (PF) 100 MCG/2ML IJ SOLN
100.0000 ug | Freq: Once | INTRAMUSCULAR | Status: AC
  Administered 2016-02-07: 100 ug via INTRAVENOUS
  Filled 2016-02-07: qty 2

## 2016-02-07 MED ORDER — KCL IN DEXTROSE-NACL 20-5-0.9 MEQ/L-%-% IV SOLN
INTRAVENOUS | Status: DC
Start: 2016-02-07 — End: 2016-02-10
  Administered 2016-02-08 – 2016-02-10 (×2): via INTRAVENOUS
  Filled 2016-02-07 (×2): qty 1000

## 2016-02-07 MED ORDER — PROPOFOL 10 MG/ML IV BOLUS
INTRAVENOUS | Status: DC | PRN
  Administered 2016-02-07: 200 mg via INTRAVENOUS

## 2016-02-07 MED ORDER — BUPIVACAINE-EPINEPHRINE (PF) 0.5% -1:200000 IJ SOLN
INTRAMUSCULAR | Status: AC
  Filled 2016-02-07: qty 30

## 2016-02-07 MED ORDER — LIDOCAINE HCL (CARDIAC) 20 MG/ML IV SOLN
INTRAVENOUS | Status: DC | PRN
  Administered 2016-02-07: 100 mg via INTRATRACHEAL

## 2016-02-07 MED ORDER — HYDROMORPHONE HCL 2 MG/ML IJ SOLN: 0.5000 mg | INTRAMUSCULAR | Status: DC | PRN

## 2016-02-07 MED ORDER — BUPIVACAINE-EPINEPHRINE 0.5% -1:200000 IJ SOLN
INTRAMUSCULAR | Status: DC | PRN
  Administered 2016-02-07: 5 mL

## 2016-02-07 MED ORDER — FENTANYL CITRATE (PF) 100 MCG/2ML IJ SOLN
INTRAMUSCULAR | Status: AC
  Filled 2016-02-07: qty 4

## 2016-02-07 MED ORDER — LIDOCAINE-EPINEPHRINE 1 %-1:100000 IJ SOLN
INTRAMUSCULAR | Status: AC
Start: 2016-02-07 — End: 2016-02-07
  Filled 2016-02-07: qty 1

## 2016-02-07 MED ORDER — DEXTROSE 5 % IV SOLN: 2.0000 g | INTRAVENOUS | Status: DC

## 2016-02-07 MED ORDER — 0.9 % SODIUM CHLORIDE (POUR BTL) OPTIME
TOPICAL | Status: DC | PRN
  Administered 2016-02-07 (×4): 1000 mL

## 2016-02-07 MED ORDER — LACTATED RINGERS IV SOLN
INTRAVENOUS | Status: DC | PRN
  Administered 2016-02-07 (×2): via INTRAVENOUS

## 2016-02-07 MED ORDER — LIDOCAINE 2% (20 MG/ML) 5 ML SYRINGE
INTRAMUSCULAR | Status: AC
  Filled 2016-02-07: qty 5

## 2016-02-07 MED ORDER — NICOTINE 7 MG/24HR TD PT24
7.0000 mg | MEDICATED_PATCH | Freq: Every day | TRANSDERMAL | Status: DC
  Administered 2016-02-08 – 2016-02-12 (×5): 7 mg via TRANSDERMAL
  Filled 2016-02-07 (×5): qty 1

## 2016-02-07 MED ORDER — PROMETHAZINE HCL 25 MG/ML IJ SOLN
6.2500 mg | INTRAMUSCULAR | Status: DC | PRN
  Administered 2016-02-07: 6.25 mg via INTRAVENOUS

## 2016-02-07 MED ORDER — IOPAMIDOL (ISOVUE-300) INJECTION 61%
INTRAVENOUS | Status: AC
  Administered 2016-02-07: 100 mL
  Filled 2016-02-07: qty 100

## 2016-02-07 MED ORDER — SODIUM CHLORIDE 0.9 % IV BOLUS (SEPSIS)
1000.0000 mL | Freq: Once | INTRAVENOUS | Status: AC
  Administered 2016-02-07: 1000 mL via INTRAVENOUS

## 2016-02-07 MED ORDER — ONDANSETRON HCL 4 MG/2ML IJ SOLN: 4.0000 mg | Freq: Four times a day (QID) | INTRAMUSCULAR | Status: DC | PRN

## 2016-02-07 MED ORDER — ROCURONIUM BROMIDE 100 MG/10ML IV SOLN
INTRAVENOUS | Status: DC | PRN
  Administered 2016-02-07: 20 mg via INTRAVENOUS
  Administered 2016-02-07: 30 mg via INTRAVENOUS

## 2016-02-07 MED ORDER — SUFENTANIL CITRATE 50 MCG/ML IV SOLN
INTRAVENOUS | Status: DC | PRN
  Administered 2016-02-07: 10 ug via INTRAVENOUS

## 2016-02-07 MED ORDER — ONDANSETRON HCL 4 MG/2ML IJ SOLN
INTRAMUSCULAR | Status: AC
  Filled 2016-02-07: qty 2

## 2016-02-07 MED ORDER — DEXAMETHASONE SODIUM PHOSPHATE 10 MG/ML IJ SOLN
INTRAMUSCULAR | Status: DC | PRN
  Administered 2016-02-07: 10 mg via INTRAVENOUS

## 2016-02-07 MED ORDER — HYDROMORPHONE HCL 2 MG/ML IJ SOLN
INTRAMUSCULAR | Status: AC
  Filled 2016-02-07: qty 1

## 2016-02-07 MED ORDER — ROCURONIUM BROMIDE 10 MG/ML (PF) SYRINGE
PREFILLED_SYRINGE | INTRAVENOUS | Status: AC
  Filled 2016-02-07: qty 10

## 2016-02-07 MED ORDER — DIPHENHYDRAMINE HCL 50 MG/ML IJ SOLN: 12.5000 mg | Freq: Four times a day (QID) | INTRAMUSCULAR | Status: DC | PRN

## 2016-02-07 MED ORDER — ONDANSETRON 4 MG PO TBDP
4.0000 mg | ORAL_TABLET | Freq: Four times a day (QID) | ORAL | Status: DC | PRN
  Filled 2016-02-07: qty 1

## 2016-02-07 MED ORDER — PROMETHAZINE HCL 25 MG/ML IJ SOLN
INTRAMUSCULAR | Status: AC
  Filled 2016-02-07: qty 1

## 2016-02-07 MED ORDER — OXYCODONE-ACETAMINOPHEN 5-325 MG PO TABS
1.0000 | ORAL_TABLET | ORAL | Status: DC | PRN
  Administered 2016-02-08 – 2016-02-09 (×2): 1 via ORAL
  Filled 2016-02-07 (×2): qty 1

## 2016-02-07 MED ORDER — MIDAZOLAM HCL 2 MG/2ML IJ SOLN
INTRAMUSCULAR | Status: AC
  Filled 2016-02-07: qty 2

## 2016-02-07 MED ORDER — SUGAMMADEX SODIUM 200 MG/2ML IV SOLN
INTRAVENOUS | Status: DC | PRN
  Administered 2016-02-07: 200 mg via INTRAVENOUS

## 2016-02-07 MED ORDER — DEXTROSE 5 % IV SOLN
2.0000 g | INTRAVENOUS | Status: DC
  Administered 2016-02-08 – 2016-02-11 (×4): 2 g via INTRAVENOUS
  Filled 2016-02-07 (×6): qty 2

## 2016-02-07 MED ORDER — FENTANYL CITRATE (PF) 100 MCG/2ML IJ SOLN
12.5000 ug | INTRAMUSCULAR | Status: DC | PRN
  Administered 2016-02-07 (×2): 100 ug via INTRAVENOUS

## 2016-02-07 MED ORDER — METRONIDAZOLE IN NACL 5-0.79 MG/ML-% IV SOLN: 500.0000 mg | Freq: Three times a day (TID) | INTRAVENOUS | Status: DC

## 2016-02-07 MED ORDER — SUCCINYLCHOLINE CHLORIDE 20 MG/ML IJ SOLN
INTRAMUSCULAR | Status: DC | PRN
  Administered 2016-02-07: 100 mg via INTRAVENOUS

## 2016-02-07 MED ORDER — SODIUM CHLORIDE 0.9 % IR SOLN
Status: DC | PRN
  Administered 2016-02-07: 1000 mL

## 2016-02-07 MED ORDER — SODIUM CHLORIDE 0.9 % IV SOLN
INTRAVENOUS | Status: DC
  Administered 2016-02-07 – 2016-02-12 (×4): via INTRAVENOUS

## 2016-02-07 MED ORDER — DEXAMETHASONE SODIUM PHOSPHATE 10 MG/ML IJ SOLN
INTRAMUSCULAR | Status: AC
  Filled 2016-02-07: qty 1

## 2016-02-07 MED ORDER — HYDROMORPHONE HCL 1 MG/ML IJ SOLN
0.2500 mg | INTRAMUSCULAR | Status: DC | PRN
  Administered 2016-02-07: 0.5 mg via INTRAVENOUS

## 2016-02-07 MED ORDER — PROPOFOL 10 MG/ML IV BOLUS
INTRAVENOUS | Status: AC
  Filled 2016-02-07: qty 20

## 2016-02-07 MED ORDER — METRONIDAZOLE IN NACL 5-0.79 MG/ML-% IV SOLN
500.0000 mg | Freq: Three times a day (TID) | INTRAVENOUS | Status: DC
  Administered 2016-02-08 – 2016-02-10 (×7): 500 mg via INTRAVENOUS
  Filled 2016-02-07 (×10): qty 100

## 2016-02-07 MED ORDER — SUCCINYLCHOLINE CHLORIDE 200 MG/10ML IV SOSY
PREFILLED_SYRINGE | INTRAVENOUS | Status: AC
  Filled 2016-02-07: qty 10

## 2016-02-07 MED ORDER — SUFENTANIL CITRATE 50 MCG/ML IV SOLN
INTRAVENOUS | Status: AC
  Filled 2016-02-07: qty 1

## 2016-02-07 MED ORDER — HYDROMORPHONE HCL 2 MG/ML IJ SOLN
1.0000 mg | INTRAMUSCULAR | Status: DC | PRN
  Administered 2016-02-08: 1 mg via INTRAVENOUS
  Filled 2016-02-07: qty 1

## 2016-02-07 MED ORDER — ONDANSETRON HCL 4 MG/2ML IJ SOLN
INTRAMUSCULAR | Status: DC | PRN
  Administered 2016-02-07: 4 mg via INTRAVENOUS

## 2016-02-07 MED ORDER — KETOROLAC TROMETHAMINE 30 MG/ML IJ SOLN: 30.0000 mg | Freq: Four times a day (QID) | INTRAMUSCULAR | Status: DC | PRN

## 2016-02-07 MED ORDER — CIPROFLOXACIN IN D5W 400 MG/200ML IV SOLN
400.0000 mg | Freq: Once | INTRAVENOUS | Status: AC
  Administered 2016-02-07: 400 mg via INTRAVENOUS
  Filled 2016-02-07: qty 200

## 2016-02-07 MED ORDER — METRONIDAZOLE IN NACL 5-0.79 MG/ML-% IV SOLN
500.0000 mg | Freq: Once | INTRAVENOUS | Status: AC
  Administered 2016-02-07: 500 mg via INTRAVENOUS
  Filled 2016-02-07: qty 100

## 2016-02-07 MED ORDER — ONDANSETRON HCL 4 MG/2ML IJ SOLN
4.0000 mg | Freq: Once | INTRAMUSCULAR | Status: AC
  Administered 2016-02-07: 4 mg via INTRAVENOUS
  Filled 2016-02-07: qty 2

## 2016-02-07 SURGICAL SUPPLY — 48 items
ADH SKN CLS APL DERMABOND .7 (GAUZE/BANDAGES/DRESSINGS) ×1
APPLIER CLIP ROT 10 11.4 M/L (STAPLE)
APR CLP MED LRG 11.4X10 (STAPLE)
BAG SPEC RTRVL LRG 6X4 10 (ENDOMECHANICALS) ×1
BLADE SURG ROTATE 9660 (MISCELLANEOUS) IMPLANT
CANISTER SUCTION 2500CC (MISCELLANEOUS) ×3 IMPLANT
CHLORAPREP W/TINT 26ML (MISCELLANEOUS) ×2 IMPLANT
CLIP APPLIE ROT 10 11.4 M/L (STAPLE) IMPLANT
COVER SURGICAL LIGHT HANDLE (MISCELLANEOUS) ×2 IMPLANT
CUTTER FLEX LINEAR 45M (STAPLE) ×2 IMPLANT
DERMABOND ADVANCED (GAUZE/BANDAGES/DRESSINGS) ×1
DERMABOND ADVANCED .7 DNX12 (GAUZE/BANDAGES/DRESSINGS) ×1 IMPLANT
DRAIN CHANNEL 19F RND (DRAIN) ×1 IMPLANT
DRAPE WARM FLUID 44X44 (DRAPE) ×1 IMPLANT
ELECT REM PT RETURN 9FT ADLT (ELECTROSURGICAL) ×2
ELECTRODE REM PT RTRN 9FT ADLT (ELECTROSURGICAL) ×1 IMPLANT
ENDOLOOP SUT PDS II  0 18 (SUTURE)
ENDOLOOP SUT PDS II 0 18 (SUTURE) IMPLANT
EVACUATOR SILICONE 100CC (DRAIN) ×1 IMPLANT
GLOVE BIO SURGEON STRL SZ8 (GLOVE) ×2 IMPLANT
GLOVE BIOGEL PI IND STRL 8 (GLOVE) ×1 IMPLANT
GLOVE BIOGEL PI INDICATOR 8 (GLOVE) ×1
GOWN STRL REUS W/ TWL LRG LVL3 (GOWN DISPOSABLE) ×2 IMPLANT
GOWN STRL REUS W/ TWL XL LVL3 (GOWN DISPOSABLE) ×1 IMPLANT
GOWN STRL REUS W/TWL LRG LVL3 (GOWN DISPOSABLE) ×4
GOWN STRL REUS W/TWL XL LVL3 (GOWN DISPOSABLE) ×4
KIT BASIN OR (CUSTOM PROCEDURE TRAY) ×2 IMPLANT
KIT ROOM TURNOVER OR (KITS) ×2 IMPLANT
NS IRRIG 1000ML POUR BTL (IV SOLUTION) ×3 IMPLANT
PAD ARMBOARD 7.5X6 YLW CONV (MISCELLANEOUS) ×4 IMPLANT
POUCH SPECIMEN RETRIEVAL 10MM (ENDOMECHANICALS) ×2 IMPLANT
RELOAD STAPLE 45 3.5 BLU ETS (ENDOMECHANICALS) ×1 IMPLANT
RELOAD STAPLE TA45 3.5 REG BLU (ENDOMECHANICALS) ×2 IMPLANT
SCALPEL HARMONIC ACE (MISCELLANEOUS) ×2 IMPLANT
SCISSORS LAP 5X35 DISP (ENDOMECHANICALS) ×2 IMPLANT
SET IRRIG TUBING LAPAROSCOPIC (IRRIGATION / IRRIGATOR) ×2 IMPLANT
SPECIMEN JAR SMALL (MISCELLANEOUS) ×2 IMPLANT
SUT ETHILON 2 0 FS 18 (SUTURE) ×1 IMPLANT
SUT MNCRL AB 4-0 PS2 18 (SUTURE) ×1 IMPLANT
SUT MON AB 4-0 PC3 18 (SUTURE) ×2 IMPLANT
TOWEL OR 17X24 6PK STRL BLUE (TOWEL DISPOSABLE) ×2 IMPLANT
TOWEL OR 17X26 10 PK STRL BLUE (TOWEL DISPOSABLE) ×2 IMPLANT
TRAY FOLEY CATH 16FR SILVER (SET/KITS/TRAYS/PACK) ×2 IMPLANT
TRAY LAPAROSCOPIC MC (CUSTOM PROCEDURE TRAY) ×2 IMPLANT
TROCAR XCEL 12X100 BLDLESS (ENDOMECHANICALS) ×1 IMPLANT
TROCAR XCEL BLADELESS 5X75MML (TROCAR) ×4 IMPLANT
TROCAR XCEL BLUNT TIP 100MML (ENDOMECHANICALS) ×2 IMPLANT
TUBING INSUFFLATION (TUBING) ×2 IMPLANT

## 2016-02-07 NOTE — Anesthesia Procedure Notes (Signed)
Procedure Name: Intubation Date/Time: 02/07/2016 7:18 PM Performed by: Melina SchoolsBANKS, Madelynn Malson J Pre-anesthesia Checklist: Patient identified, Emergency Drugs available, Suction available, Patient being monitored and Timeout performed Patient Re-evaluated:Patient Re-evaluated prior to inductionOxygen Delivery Method: Circle system utilized Preoxygenation: Pre-oxygenation with 100% oxygen Intubation Type: IV induction Ventilation: Mask ventilation without difficulty Laryngoscope Size: Mac and 3 Grade View: Grade I Tube type: Oral Tube size: 7.5 mm Number of attempts: 1 Airway Equipment and Method: Stylet Placement Confirmation: ETT inserted through vocal cords under direct vision,  positive ETCO2 and breath sounds checked- equal and bilateral Secured at: 23 cm Tube secured with: Tape Dental Injury: Teeth and Oropharynx as per pre-operative assessment

## 2016-02-07 NOTE — H&P (Signed)
Tracy [name] is an 45 y.o. female.   Chief Complaint: abdominal pain, nausea and fever PCP:  Worthy Rancher, MD  HPI: Pt presented to PCP today with above complaints.  She has had pain for 2 days that was epigastric and then became RLQ pain.  It started Wednesday 10/25, in the evening.  She was not able to eat, she had kept some clear liquids down. and  She has had prior cholecystectomy.  She vomited x 1 on Wednesday. She was referred to [facility] ED after being seen at PCP this AM. Work up in the ED shows a low grade fever.  CMP is OK, WBC is 25.3, otherwise normal.  Possible UTI on U/A. CT scan: The appendix is markedly thickened measuring approximately 1.7 cm in diameter (coronal image 46, series 5) with associated moderate amount of periappendiceal stranding, findings worrisome for acute appendicitis. No evidence of perforation or definable / drainable fluid collection. Colonic diverticulosis without evidence of diverticulitis.  We are ask to see.  She has had Cipro and Flagyl IV ordered, Flagyl is going while I was there.   Past Medical History:  Diagnosis Date  . GERD (gastroesophageal reflux disease)   . Hyperlipidemia   . PONV (postoperative nausea and vomiting)    had to use a scop patch 2nd c-sec  . Wears dentures    top    Past Surgical History:  Procedure Laterality Date  . CESAREAN SECTION  3235,5732   Two  . CHOLECYSTECTOMY N/A 07/12/2013   Procedure: LAPAROSCOPIC CHOLECYSTECTOMY WITH INTRAOPERATIVE CHOLANGIOGRAM;  Surgeon: Joyice Faster. Cornett, MD;  Location: [place];  Service: General;  Laterality: N/A;  . MULTIPLE TOOTH EXTRACTIONS      Family History  Problem Relation Age of Onset  . COPD Mother   . Heart disease Mother   . Heart disease Father   . Diabetes Father   . Heart disease Brother   . Breast cancer      aunt  . Colon cancer      great uncle  . Lung cancer      uncle  . Esophageal cancer      grandfather   Social History:  reports  that she has been smoking.  She has been smoking about 1.00 pack per day. She has never used smokeless tobacco. She reports that she does not drink alcohol or use drugs.   Tobacco: 1PPD 18 years ETOH:  None Drugs:  None   Allergies:  Allergies  Allergen Reactions  . Codeine Nausea And Vomiting  . Bactrim [Sulfamethoxazole-Trimethoprim] Hives    Prior to Admission medications   Medication Sig Start Date End Date Taking? Authorizing Provider  citalopram (CELEXA) 10 MG tablet Take 1 tablet (10 mg total) by mouth daily. 01/30/16  Yes Eustaquio Maize, MD  ibuprofen (ADVIL,MOTRIN) 200 MG tablet Take 200 mg by mouth every 6 (six) hours as needed for moderate pain.   Yes Historical Provider, MD  naproxen (NAPROSYN) 500 MG tablet Take 1 tablet (500 mg total) by mouth 2 (two) times daily with a meal. Patient taking differently: Take 500 mg by mouth 2 (two) times daily as needed for moderate pain.  06/23/14  Yes Mary-Margaret Hassell Done, FNP  omeprazole (PRILOSEC) 20 MG capsule Take 1 capsule (20 mg total) by mouth daily. 01/30/16  Yes Eustaquio Maize, MD     Results for orders placed or performed during the hospital encounter of 02/07/16 (from the past 48 hour(s))  Lipase, blood  Status: None   Collection Time: 02/07/16 11:56 AM  Result Value Ref Range   Lipase 20 11 - 51 U/L  Comprehensive metabolic panel     Status: Abnormal   Collection Time: 02/07/16 11:56 AM  Result Value Ref Range   Sodium 136 135 - 145 mmol/L   Potassium 3.5 3.5 - 5.1 mmol/L   Chloride 100 (L) 101 - 111 mmol/L   CO2 24 22 - 32 mmol/L   Glucose, Bld 130 (H) 65 - 99 mg/dL   BUN 5 (L) 6 - 20 mg/dL   Creatinine, Ser 0.85 0.44 - 1.00 mg/dL   Calcium 9.1 8.9 - 10.3 mg/dL   Total Protein 7.1 6.5 - 8.1 g/dL   Albumin 3.8 3.5 - 5.0 g/dL   AST 18 15 - 41 U/L   ALT 18 14 - 54 U/L   Alkaline Phosphatase 54 38 - 126 U/L   Total Bilirubin 1.0 0.3 - 1.2 mg/dL   GFR calc non Af Amer >60 >60 mL/min   GFR calc Af Amer >60 >60  mL/min    Comment: (NOTE) The eGFR has been calculated using the CKD EPI equation. This calculation has not been validated in all clinical situations. eGFR's persistently <60 mL/min signify possible Chronic Kidney Disease.    Anion gap 12 5 - 15  CBC     Status: Abnormal   Collection Time: 02/07/16 11:56 AM  Result Value Ref Range   WBC 25.3 (H) 4.0 - 10.5 K/uL   RBC 5.09 3.87 - 5.11 MIL/uL   Hemoglobin 14.1 12.0 - 15.0 g/dL   HCT 42.5 36.0 - 46.0 %   MCV 83.5 78.0 - 100.0 fL   MCH 27.7 26.0 - 34.0 pg   MCHC 33.2 30.0 - 36.0 g/dL   RDW 13.9 11.5 - 15.5 %   Platelets 318 150 - 400 K/uL  Urinalysis, Routine w reflex microscopic     Status: Abnormal   Collection Time: 02/07/16  1:01 PM  Result Value Ref Range   Color, Urine AMBER (A) YELLOW    Comment: BIOCHEMICALS MAY BE AFFECTED BY COLOR   APPearance CLOUDY (A) CLEAR   Specific Gravity, Urine 1.021 1.005 - 1.030   pH 6.0 5.0 - 8.0   Glucose, UA NEGATIVE NEGATIVE mg/dL   Hgb urine dipstick SMALL (A) NEGATIVE   Bilirubin Urine SMALL (A) NEGATIVE   Ketones, ur >80 (A) NEGATIVE mg/dL   Protein, ur 100 (A) NEGATIVE mg/dL   Nitrite POSITIVE (A) NEGATIVE   Leukocytes, UA SMALL (A) NEGATIVE  Urine microscopic-add on     Status: Abnormal   Collection Time: 02/07/16  1:01 PM  Result Value Ref Range   Squamous Epithelial / LPF 6-30 (A) NONE SEEN   WBC, UA 6-30 0 - 5 WBC/hpf   RBC / HPF 0-5 0 - 5 RBC/hpf   Bacteria, UA MANY (A) NONE SEEN   Casts GRANULAR CAST (A) NEGATIVE   Urine-Other MUCOUS PRESENT   POC urine preg, ED (not at [facility])     Status: None   Collection Time: 02/07/16  1:28 PM  Result Value Ref Range   Preg Test, Ur NEGATIVE NEGATIVE    Comment:        THE SENSITIVITY OF THIS METHODOLOGY IS >24 mIU/mL    Ct Abdomen Pelvis W Contrast  Result Date: 02/07/2016 CLINICAL DATA:  Evaluate for appendicitis or pancreatitis. EXAM: CT ABDOMEN AND PELVIS WITH CONTRAST TECHNIQUE: Multidetector CT imaging of the abdomen and  pelvis was performed  using the standard protocol following bolus administration of intravenous contrast. CONTRAST:  100 cc Isovue-300 COMPARISON:  None. FINDINGS: Lower chest: Limited visualization of the lower thorax demonstrates minimal dependent subpleural ground-glass atelectasis. No focal airspace opacities. No pleural effusion. Normal heart size. No pericardial effusion. Note is made of prominent bilateral epicardial fat pads with a prominent though nonenlarged pericardial lymph node which measures approximately 0.6 cm in greatest short axis diameter (image 11, series 2), presumably reactive in etiology. Hepatobiliary: Normal hepatic contour. Minimal amount of focal fatty infiltration adjacent to the fissure for ligamentum teres. No discrete hepatic lesions. Post cholecystectomy. No intra or extrahepatic biliary ductal dilatation. No ascites. Pancreas: Normal appearance of the pancreas. No peripancreatic stranding. Spleen: Normal appearance of the spleen. Adrenals/Urinary Tract: There is symmetric enhancement and excretion of the bilateral kidneys. No definite renal stones on this postcontrast examination. Punctate sub cm hypo attenuating left-sided renal lesions are too small to adequately characterize though favored to represent renal cysts. No urinary obstruction or perinephric stranding. Normal appearance the bilateral adrenal glands. Normal appearance of the urinary bladder given underdistention. Stomach/Bowel: The appendix is markedly thickened measuring approximately 1.7 cm in diameter (coronal image 46, series 5) with associated moderate amount of periappendiceal stranding, findings worrisome for acute appendicitis. No evidence of perforation or definable / drainable fluid collection. Scattered colonic diverticulosis without evidence of diverticulitis. The bowel is otherwise normal in course and caliber without wall thickening or evidence of enteric obstruction. No pneumoperitoneum, pneumatosis or  portal venous gas. Vascular/Lymphatic: Scattered minimal amount of mixed calcified and noncalcified atherosclerotic plaque within a normal caliber abdominal aorta. The major branch vessels of the abdominal aorta appear patent on this non CTA examination. Scattered retroperitoneal lymph nodes are numerous though individually not enlarged by size criteria with index aortocaval lymph node measuring 0.7 cm in greatest short axis diameter. No bulky retroperitoneal, mesenteric, pelvic or inguinal lymphadenopathy. Reproductive: Normal appearance of the pelvic organs. No free fluid in the pelvic cul-de-sac. Other: Small mesenteric fat containing periumbilical hernia. Regional soft tissues appear otherwise normal. Musculoskeletal: No acute or aggressive osseous abnormalities. 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 IMPRESSION: 1. Findings compatible with acute uncomplicated appendicitis. No evidence of perforation or definable / drainable fluid collection. 2. Colonic diverticulosis without evidence of diverticulitis. 3. 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 Electronically Signed   By: Sandi Mariscal M.D.   On: 02/07/2016 14:26    Review of Systems  Constitutional: Positive for fever (WED PM, did not measure). Negative for diaphoresis, malaise/fatigue and weight loss.  HENT: Negative.   Eyes: Negative.   Respiratory: Negative.   Cardiovascular: Negative.   Gastrointestinal: Positive for abdominal pain (mid epigastric now down in RLQ), constipation, diarrhea, heartburn, nausea and vomiting.       She has issues with constipation followed by loose stools  Genitourinary: Negative.   Musculoskeletal: Negative.   Skin: Negative.   Neurological: Negative.  Negative for weakness.  Endo/Heme/Allergies: Negative.    Psychiatric/Behavioral: The patient is nervous/anxious (started Celexa recently but not able to take since Wed.).     Blood pressure 111/71, pulse 112, temperature 98.6 F (37 C), temperature source Oral, resp. rate 20, height 4' 11.75" (1.518 m), weight 85.3 kg (188 lb), SpO2 96 %. Physical Exam  Constitutional: She is oriented to person, place,  and time. She appears well-developed and well-nourished. No distress.  Body mass index is 37.02 kg/m.   HENT:  Head: Normocephalic and atraumatic.  Nose: Nose normal.  Mouth/Throat: No oropharyngeal exudate.  Eyes: Right eye exhibits no discharge. Left eye exhibits no discharge. No scleral icterus.  Neck: Normal range of motion. Neck supple. No JVD present. No tracheal deviation present. No thyromegaly present.  Cardiovascular: Normal rate, regular rhythm, normal heart sounds and intact distal pulses.   No murmur heard. Respiratory: Effort normal and breath sounds normal. No respiratory distress. She has no wheezes. She has no rales. She exhibits no tenderness.  GI: Soft. She exhibits no distension and no mass. There is tenderness (Pain now RLQ). There is no rebound and no guarding.  Musculoskeletal: She exhibits no edema or tenderness.  Neurological: She is alert and oriented to person, place, and time. No cranial nerve deficit.  Skin: Skin is warm and dry. No rash noted. She is not diaphoretic. No erythema. No pallor.  Psychiatric: She has a normal mood and affect. Her behavior is normal. Judgment and thought content normal.     Assessment/Plan Acute appendicitis Possible UTI S/p cholecystectomy Hyperlipidemia Hx of tobacco use Anxiety GERD  Plan:  NPO, set up for laparoscopic appendectomy.  Risk and benefits discussed.  She will be seen by Dr. Hulen Skains or Cornett.  Caidence Higashi, PA-C 02/07/2016, 3:51 PM

## 2016-02-07 NOTE — ED Triage Notes (Signed)
Per Pt, Pt is coming from home with abdominal pain that started on Wednesday. Pt reports fever, nausea, and vomiting. Denies urinary symptoms or continuous diarrhea. Hx of Gallbladder removal.

## 2016-02-07 NOTE — Progress Notes (Signed)
BP 114/73   Pulse (!) 128   Temp 97 F (36.1 C) (Oral)   Ht 4' 11.75" (1.518 m)   Wt 188 lb 3.2 oz (85.4 kg)   BMI 37.06 kg/m    Subjective:    Patient ID: Tracy [name], female    DOB: [birth year], 45 y.o.   MRN: [id number]  HPI: Tracy HinesKelly J Bathgate is a 45 y.o. female presenting on 02/07/2016 for Abdominal Pain; Nausea; Emesis; and Fever Has 2 days epigastric pain, quick migrated to RLQ.  Vomited once on Wednesday, No eating or drinking since. Continued with nausea. Had GB surgery in past. Pain is severe at times, hard to even move.  GERD has been well controlled   Relevant past medical, surgical, family and social history reviewed and updated as indicated. Allergies and medications reviewed and updated.  Past Medical History:  Diagnosis Date  . GERD (gastroesophageal reflux disease)   . Hyperlipidemia   . PONV (postoperative nausea and vomiting)    had to use a scop patch 2nd c-sec  . Wears dentures    top    Past Surgical History:  Procedure Laterality Date  . CESAREAN SECTION  [id number]   Two  . CHOLECYSTECTOMY N/A 07/12/2013   Procedure: LAPAROSCOPIC CHOLECYSTECTOMY WITH INTRAOPERATIVE CHOLANGIOGRAM;  Surgeon: Clovis Puhomas A. Cornett, MD;  Location: [facility];  Service: General;  Laterality: N/A;  . MULTIPLE TOOTH EXTRACTIONS      Review of Systems  Constitutional: Positive for activity change, appetite change, chills and fever.  HENT: Negative.   Eyes: Negative.   Respiratory: Negative.   Gastrointestinal: Positive for abdominal pain and vomiting. Negative for constipation and diarrhea.  Genitourinary: Negative.       Medication List       Accurate as of 02/07/16 10:30 AM. Always use your most recent med list.          cetirizine 10 MG tablet Commonly known as:  ZYRTEC Take 10 mg by mouth daily.   citalopram 10 MG tablet Commonly known as:  CELEXA Take 1 tablet (10 mg total) by mouth daily.   FIBER PO Take by mouth.   naproxen 500 MG  tablet Commonly known as:  NAPROSYN Take 1 tablet (500 mg total) by mouth 2 (two) times daily with a meal.   omeprazole 20 MG capsule Commonly known as:  PRILOSEC Take 1 capsule (20 mg total) by mouth daily.          Objective:    BP 114/73   Pulse (!) 128   Temp 97 F (36.1 C) (Oral)   Ht 4' 11.75" (1.518 m)   Wt 188 lb 3.2 oz (85.4 kg)   BMI 37.06 kg/m   Allergies  Allergen Reactions  . Codeine Nausea And Vomiting  . Bactrim [Sulfamethoxazole-Trimethoprim] Hives    Physical Exam  Constitutional: She is oriented to person, place, and time. She appears well-developed and well-nourished. She appears distressed.  HENT:  Head: Normocephalic and atraumatic.  Eyes: Conjunctivae and EOM are normal. Pupils are equal, round, and reactive to light.  Cardiovascular: Normal rate, regular rhythm, normal heart sounds and intact distal pulses.   Pulmonary/Chest: Effort normal and breath sounds normal.  Abdominal: She exhibits no mass. Bowel sounds are absent. There is tenderness in the right lower quadrant. There is tenderness at McBurney's point. There is no rigidity, no guarding and negative Murphy's sign. No hernia.    Neurological: She is alert and oriented to person, place, and time. She has  normal reflexes.  Skin: Skin is warm and dry. No rash noted. She is not diaphoretic.  Psychiatric: She has a normal mood and affect. Her behavior is normal. Judgment and thought content normal.       Assessment & Plan:   1. Acute abdominal pain in right lower quadrant Report to [facility] for work up. Leaving office and going there.  2. Fever in other diseases   Continue all other maintenance medications as listed above.  Follow up plan: ED for work up acute appendicitis  Educational handout given for acute abdomen  Remus Loffler PA-C [facility] [address]  [place], [place] [address] [phone]   02/07/2016, 10:30 AM

## 2016-02-07 NOTE — Patient Instructions (Signed)

## 2016-02-07 NOTE — ED Provider Notes (Signed)
[facility]-EMERGENCY DEPT Provider Note   CSN: [id number] Arrival date & time: 02/07/16  1136     History   Chief Complaint Chief Complaint  Patient presents with  . Abdominal Pain    HPI Tracy [name] is a 45 y.o. female.  The history is provided by the patient.  Abdominal Pain   This is a new problem. The current episode started yesterday. The problem occurs constantly. The problem has been gradually worsening. The pain is located in the RLQ. The pain is severe. Associated symptoms include anorexia, fever and vomiting. Nothing aggravates the symptoms. Nothing relieves the symptoms. Past workup does not include GI consult.    Past Medical History:  Diagnosis Date  . GERD (gastroesophageal reflux disease)   . Hyperlipidemia   . PONV (postoperative nausea and vomiting)    had to use a scop patch 2nd c-sec  . Wears dentures    top    Patient Active Problem List   Diagnosis Date Noted  . Acute appendicitis 02/07/2016  . GERD (gastroesophageal reflux disease) 01/30/2016  . Tobacco abuse counseling 01/30/2016  . BMI 38.0-38.9,adult 01/30/2016  . Adjustment disorder with anxious mood 01/30/2016  . Post-operative state 07/28/2013  . Gallstones and inflammation of gallbladder without obstruction 06/16/2013    Past Surgical History:  Procedure Laterality Date  . CESAREAN SECTION  [id number]   Two  . CHOLECYSTECTOMY N/A 07/12/2013   Procedure: LAPAROSCOPIC CHOLECYSTECTOMY WITH INTRAOPERATIVE CHOLANGIOGRAM;  Surgeon: Clovis Pu. Cornett, MD;  Location: [facility];  Service: General;  Laterality: N/A;  . MULTIPLE TOOTH EXTRACTIONS      OB History    No data available       Home Medications    Prior to Admission medications   Medication Sig Start Date End Date Taking? Authorizing Provider  citalopram (CELEXA) 10 MG tablet Take 1 tablet (10 mg total) by mouth daily. 01/30/16  Yes Johna Sheriff, MD  ibuprofen (ADVIL,MOTRIN) 200 MG tablet Take 200 mg by mouth  every 6 (six) hours as needed for moderate pain.   Yes Historical Provider, MD  naproxen (NAPROSYN) 500 MG tablet Take 1 tablet (500 mg total) by mouth 2 (two) times daily with a meal. Patient taking differently: Take 500 mg by mouth 2 (two) times daily as needed for moderate pain.  06/23/14  Yes Mary-Margaret Daphine Deutscher, FNP  omeprazole (PRILOSEC) 20 MG capsule Take 1 capsule (20 mg total) by mouth daily. 01/30/16  Yes Johna Sheriff, MD    Family History Family History  Problem Relation Age of Onset  . COPD Mother   . Heart disease Mother   . Heart disease Father   . Diabetes Father   . Heart disease Brother   . Breast cancer      aunt  . Colon cancer      great uncle  . Lung cancer      uncle  . Esophageal cancer      grandfather    Social History Social History  Substance Use Topics  . Smoking status: Current Every Day Smoker    Packs/day: 1.00  . Smokeless tobacco: Never Used  . Alcohol use No     Comment: rare     Allergies   Codeine and Bactrim [sulfamethoxazole-trimethoprim]   Review of Systems Review of Systems  Constitutional: Positive for fever.  Gastrointestinal: Positive for abdominal pain, anorexia and vomiting.  All other systems reviewed and are negative.    Physical Exam Updated Vital Signs BP 112/73  Pulse 116   Temp 100.3 F (37.9 C) (Oral)   Resp 20   Ht 4' 11.75" (1.518 m)   Wt 188 lb (85.3 kg)   SpO2 94%   BMI 37.02 kg/m   Physical Exam  Constitutional: She is oriented to person, place, and time. She appears well-developed and well-nourished.  HENT:  Head: Normocephalic and atraumatic.  Eyes: Conjunctivae and EOM are normal.  Neck: Normal range of motion.  Cardiovascular: Regular rhythm.  Tachycardia present.   Pulmonary/Chest: No stridor. No respiratory distress.  Abdominal: She exhibits no distension. There is tenderness. There is guarding (rlq).  Neurological: She is alert and oriented to person, place, and time. No cranial  nerve deficit. Coordination normal.  Nursing note and vitals reviewed.    ED Treatments / Results  Labs (all labs ordered are listed, but only abnormal results are displayed) Labs Reviewed  COMPREHENSIVE METABOLIC PANEL - Abnormal; Notable for the following:       Result Value   Chloride 100 (*)    Glucose, Bld 130 (*)    BUN 5 (*)    All other components within normal limits  CBC - Abnormal; Notable for the following:    WBC 25.3 (*)    All other components within normal limits  URINALYSIS, ROUTINE W REFLEX MICROSCOPIC (NOT AT [facility]) - Abnormal; Notable for the following:    Color, Urine AMBER (*)    APPearance CLOUDY (*)    Hgb urine dipstick SMALL (*)    Bilirubin Urine SMALL (*)    Ketones, ur >80 (*)    Protein, ur 100 (*)    Nitrite POSITIVE (*)    Leukocytes, UA SMALL (*)    All other components within normal limits  URINE MICROSCOPIC-ADD ON - Abnormal; Notable for the following:    Squamous Epithelial / LPF 6-30 (*)    Bacteria, UA MANY (*)    Casts GRANULAR CAST (*)    All other components within normal limits  URINE CULTURE  LIPASE, BLOOD  CBC  BASIC METABOLIC PANEL  POC URINE PREG, ED    EKG  EKG Interpretation None       Radiology Ct Abdomen Pelvis W Contrast  Result Date: 02/07/2016 CLINICAL DATA:  Evaluate for appendicitis or pancreatitis. EXAM: CT ABDOMEN AND PELVIS WITH CONTRAST TECHNIQUE: Multidetector CT imaging of the abdomen and pelvis was performed using the standard protocol following bolus administration of intravenous contrast. CONTRAST:  100 cc Isovue-300 COMPARISON:  None. FINDINGS: Lower chest: Limited visualization of the lower thorax demonstrates minimal dependent subpleural ground-glass atelectasis. No focal airspace opacities. No pleural effusion. Normal heart size. No pericardial effusion. Note is made of prominent bilateral epicardial fat pads with a prominent though nonenlarged pericardial lymph node which measures approximately 0.6  cm in greatest short axis diameter (image 11, series 2), presumably reactive in etiology. Hepatobiliary: Normal hepatic contour. Minimal amount of focal fatty infiltration adjacent to the fissure for ligamentum teres. No discrete hepatic lesions. Post cholecystectomy. No intra or extrahepatic biliary ductal dilatation. No ascites. Pancreas: Normal appearance of the pancreas. No peripancreatic stranding. Spleen: Normal appearance of the spleen. Adrenals/Urinary Tract: There is symmetric enhancement and excretion of the bilateral kidneys. No definite renal stones on this postcontrast examination. Punctate sub cm hypo attenuating left-sided renal lesions are too small to adequately characterize though favored to represent renal cysts. No urinary obstruction or perinephric stranding. Normal appearance the bilateral adrenal glands. Normal appearance of the urinary bladder given underdistention. Stomach/Bowel: The appendix is  markedly thickened measuring approximately 1.7 cm in diameter (coronal image 46, series 5) with associated moderate amount of periappendiceal stranding, findings worrisome for acute appendicitis. No evidence of perforation or definable / drainable fluid collection. Scattered colonic diverticulosis without evidence of diverticulitis. The bowel is otherwise normal in course and caliber without wall thickening or evidence of enteric obstruction. No pneumoperitoneum, pneumatosis or portal venous gas. Vascular/Lymphatic: Scattered minimal amount of mixed calcified and noncalcified atherosclerotic plaque within a normal caliber abdominal aorta. The major branch vessels of the abdominal aorta appear patent on this non CTA examination. Scattered retroperitoneal lymph nodes are numerous though individually not enlarged by size criteria with index aortocaval lymph node measuring 0.7 cm in greatest short axis diameter. No bulky retroperitoneal, mesenteric, pelvic or inguinal lymphadenopathy. Reproductive:  Normal appearance of the pelvic organs. No free fluid in the pelvic cul-de-sac. Other: Small mesenteric fat containing periumbilical hernia. Regional soft tissues appear otherwise normal. Musculoskeletal: No acute or aggressive osseous abnormalities. 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 IMPRESSION: 1. Findings compatible with acute uncomplicated appendicitis. No evidence of perforation or definable / drainable fluid collection. 2. Colonic diverticulosis without evidence of diverticulitis. 3. 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 Electronically Signed   By: Simonne Come M.D.   On: 02/07/2016 14:26    Procedures Procedures (including critical care time)  Medications Ordered in ED Medications  sodium chloride 0.9 % bolus 1,000 mL (0 mLs Intravenous Stopped 02/07/16 1407)    And  0.9 %  sodium chloride infusion ( Intravenous New Bag/Given 02/07/16 1434)  dextrose 5 % and 0.9 % NaCl with KCl 20 mEq/L infusion (not administered)  diphenhydrAMINE (BENADRYL) 12.5 MG/5ML elixir 12.5 mg (not administered)    Or  diphenhydrAMINE (BENADRYL) injection 12.5 mg (not administered)  ondansetron (ZOFRAN-ODT) disintegrating tablet 4 mg (not administered)    Or  ondansetron (ZOFRAN) injection 4 mg (not administered)  nicotine (NICODERM CQ - dosed in mg/24 hr) patch 7 mg (not administered)  fentaNYL (SUBLIMAZE) injection 12.5-25 mcg (not administered)  cefTRIAXone (ROCEPHIN) 2 g in dextrose 5 % 50 mL IVPB (not administered)    And  metroNIDAZOLE (FLAGYL) IVPB 500 mg (not administered)  fentaNYL (SUBLIMAZE) injection 100 mcg (100 mcg Intravenous Given 02/07/16 1306)  ondansetron (ZOFRAN) injection 4 mg (4 mg Intravenous Given 02/07/16 1306)  iopamidol (ISOVUE-300) 61 % injection (100 mLs  Contrast Given 02/07/16 1357)    ciprofloxacin (CIPRO) IVPB 400 mg (0 mg Intravenous Stopped 02/07/16 1700)  metroNIDAZOLE (FLAGYL) IVPB 500 mg (0 mg Intravenous Stopped 02/07/16 1535)     Initial Impression / Assessment and Plan / ED Course  I have reviewed the triage vital signs and the nursing notes.  Pertinent labs & imaging results that were available during my care of the patient were reviewed by me and considered in my medical decision making (see chart for details).  Clinical Course    Uncomplicated appendicitis with localized peritonitis and tachycardia. Likely UTI. Cipro/flagyl should cover for appendicitis and UTI.   Final Clinical Impressions(s) / ED Diagnoses   Final diagnoses:  Acute cystitis without hematuria  Lower urinary tract infectious disease    New Prescriptions Current Discharge Medication List       Marily Memos, MD 02/07/16 [phone]

## 2016-02-07 NOTE — Anesthesia Postprocedure Evaluation (Signed)
Anesthesia Post Note  Patient: Tracy [name]  Procedure(s) Performed: Procedure(s) (LRB): APPENDECTOMY LAPAROSCOPIC (N/A)  Patient location during evaluation: PACU Anesthesia Type: General Level of consciousness: awake and alert Pain management: pain level controlled Vital Signs Assessment: post-procedure vital signs reviewed and stable Respiratory status: spontaneous breathing, nonlabored ventilation, respiratory function stable and patient connected to nasal cannula oxygen Cardiovascular status: blood pressure returned to baseline and stable Postop Assessment: no signs of nausea or vomiting Anesthetic complications: no    Last Vitals:  Vitals:   02/07/16 2215 02/07/16 2230  BP: 121/80 130/86  Pulse:  (!) 110  Resp:  18  Temp:      Last Pain:  Vitals:   02/07/16 2228  TempSrc:   PainSc: 7                  Reino KentJudd, Rhyse Loux J

## 2016-02-07 NOTE — Transfer of Care (Signed)
Immediate Anesthesia Transfer of Care Note  Patient: Tracy [name]  Procedure(s) Performed: Procedure(s): APPENDECTOMY LAPAROSCOPIC (N/A)  Patient Location: PACU  Anesthesia Type:General  Level of Consciousness: awake, sedated, patient cooperative and responds to stimulation  Airway & Oxygen Therapy: Patient Spontanous Breathing and Patient connected to nasal cannula oxygen  Post-op Assessment: Report given to RN, Post -op Vital signs reviewed and stable and Patient moving all extremities X 4  Post vital signs: Reviewed and stable  Last Vitals:  Vitals:   02/07/16 1705 02/07/16 1818  BP:  117/62  Pulse:  (!) 124  Resp:  16  Temp: 37.9 C (!) 38 C    Last Pain:  Vitals:   02/07/16 1818  TempSrc: Oral  PainSc:          Complications: No apparent anesthesia complications

## 2016-02-07 NOTE — Progress Notes (Signed)
Patient admitted from ED around 1700H. She is going for surgery soon. [facility] using SBAR.

## 2016-02-07 NOTE — Anesthesia Preprocedure Evaluation (Signed)
Anesthesia Evaluation  Patient identified by MRN, date of birth, ID band Patient awake    Reviewed: Allergy & Precautions, H&P , NPO status , Patient's Chart, lab work & pertinent test results  History of Anesthesia Complications (+) PONV and history of anesthetic complications  Airway Mallampati: II  TM Distance: >3 FB Neck ROM: Full    Dental  (+) Upper Dentures, Dental Advisory Given   Pulmonary Current Smoker,    Pulmonary exam normal breath sounds clear to auscultation       Cardiovascular negative cardio ROS   Rhythm:Regular Rate:Normal     Neuro/Psych PSYCHIATRIC DISORDERS negative neurological ROS     GI/Hepatic Neg liver ROS, GERD  Medicated and Controlled,  Endo/Other  negative endocrine ROS  Renal/GU negative Renal ROS  negative genitourinary   Musculoskeletal   Abdominal   Peds  Hematology negative hematology ROS (+)   Anesthesia Other Findings   Reproductive/Obstetrics negative OB ROS                             Anesthesia Physical  Anesthesia Plan  ASA: II  Anesthesia Plan: General   Post-op Pain Management:    Induction: Intravenous  Airway Management Planned: Oral ETT  Additional Equipment:   Intra-op Plan:   Post-operative Plan: Extubation in OR  Informed Consent: I have reviewed the patients History and Physical, chart, labs and discussed the procedure including the risks, benefits and alternatives for the proposed anesthesia with the patient or authorized representative who has indicated his/her understanding and acceptance.   Dental advisory given  Plan Discussed with: CRNA  Anesthesia Plan Comments:         Anesthesia Quick Evaluation

## 2016-02-07 NOTE — Progress Notes (Signed)
Ceftriaxone and Flagyl retimed for next doses to be due at 1800- 30 minutes before add-on surgery as per OR schedule at this time.  Pharmacy to sign off as no dose adjustments required.  Jheri Mitter D. Shanekia Latella, PharmD, BCPS Clinical Pharmacist 02/07/2016 4:09 PM

## 2016-02-07 NOTE — Op Note (Signed)
Preoperative diagnosis: Acute appendicitis  Postoperative diagnosis: Perforated appendicitis with periappendiceal abscess  Procedure: Laparoscopic appendectomy with drainage of intra-abdominal abscess  Surgeon: Erroll Luna M.D.  Anesthesia: Gen. with 0.25% Sensorcaine local with epinephrine  EBL: 100 mL  Specimen: Fragments of perforated appendix to pathology  Drains: 19 round Blake drain to right colonic gutter and pelvis  Indications for procedure: The patient's a 45 year old female with 2 day history of abdominal pain. CT showed acute appendicitis. She was evaluated by Dr. Hulen Skains be due to a multiple emergencies I was asked to see her. She had signs of acute appendicitis. The CT scan was reviewed and there is some question my mind about perforation. I did not see an abscess that felt that operative intervention would be the best course of action. Risks, benefits and alternative therapies were discussed including antibiotic therapy alone. Risk of bleeding, infection, bowel injury, fistula, abscess, blood clots, stroke, DVT, wound infections, hernias, open surgery, bowel resection, and the need for other treatment is and/or operations discussed with the patient. She agreed to proceed.  Description of procedure: The patient was met in the holding area and questions are answered. After estimation the procedure as well as risks and benefits and alternative therapies she voiced her opinion she was to proceed. She was taken back to the operating room and placed supine on the operating room table. After induction of general anesthesia, a Foley catheter was placed under direct vision. The abdomen was prepped and draped in a sterile fashion and she was on preoperative antibiotics. Timeout was done. 1 cm supraumbilical incision was made and dissection was carried down to the fascia. The fascia was opened in the midline. The abdominal cavity was entered. Pursestring suture of 0 Vicryl was placed a 12 mm  Hassan cannula was placed under direct vision. After pneumoperitoneum was attempted obtained, laparoscope placed. She'll large right lower quadrant phlegmon. I placed 25 mm ports 1 in the upper midline and the second in the lower midline. We carefully dissected out the phlegmon and encountered a large abscess in the right gutter. This was debrided. The appendix was completely obliterated except for the base which I could see out carefully dissected at the bases injure the cecum. The remainder of the appendix had freely perforated and and was in multiple fragments. After evacuating the abscess I found the patient appendix is able across this with a GIA 45 blue load. This appeared to be healthy and hold the staple line. We irrigated with 4 L of saline. Hemostasis was achieved. The appendiceal fragments removed as I suction irrigated and multiple small fibroids since the appendix a completely blown apart.. Abscess cavity was clearly irrigated. There was no  injury to the terminal ileum. The cecum appeared intact without injury. 19 round drain placed to lower port site in the right gutter and pelvis. Axis irrigation was suctioned. 4 quadrant laparoscopy revealed no injury to small bowel colon. Portions CO2 was allowed to escape. Fascia closed with 0 Vicryl. Skin closed 4-0 Monocryl. Liquid adhesive applied. Drain placed to bulb suction. All final counts are found to be correct. Specimen fragments were passed off the field. Patient awoke extubated taken to recovery in satisfactory condition.

## 2016-02-07 NOTE — ED Notes (Signed)
Patient going to xray

## 2016-02-08 LAB — CBC
HCT: 38 % (ref 36.0–46.0)
Hemoglobin: 12.2 g/dL (ref 12.0–15.0)
MCH: 27.1 pg (ref 26.0–34.0)
MCHC: 32.1 g/dL (ref 30.0–36.0)
MCV: 84.4 fL (ref 78.0–100.0)
Platelets: 279 10*3/uL (ref 150–400)
RBC: 4.5 MIL/uL (ref 3.87–5.11)
RDW: 14 % (ref 11.5–15.5)
WBC: 18.3 10*3/uL — ABNORMAL HIGH (ref 4.0–10.5)

## 2016-02-08 LAB — BASIC METABOLIC PANEL
Anion gap: 9 (ref 5–15)
BUN: 5 mg/dL — ABNORMAL LOW (ref 6–20)
CO2: 21 mmol/L — ABNORMAL LOW (ref 22–32)
Calcium: 8.2 mg/dL — ABNORMAL LOW (ref 8.9–10.3)
Chloride: 105 mmol/L (ref 101–111)
Creatinine, Ser: 0.83 mg/dL (ref 0.44–1.00)
GFR calc Af Amer: >60 mL/min (ref >60)
GFR calc non Af Amer: >60 mL/min (ref >60)
Glucose, Bld: 154 mg/dL — ABNORMAL HIGH (ref 65–99)
Potassium: 4 mmol/L (ref 3.5–5.1)
Sodium: 135 mmol/L (ref 135–145)

## 2016-02-08 MED ORDER — CALCIUM CARBONATE ANTACID 500 MG PO CHEW
1.0000 | CHEWABLE_TABLET | Freq: Two times a day (BID) | ORAL | Status: DC
  Administered 2016-02-08 – 2016-02-12 (×8): 200 mg via ORAL
  Filled 2016-02-08 (×8): qty 1

## 2016-02-08 MED ORDER — ENOXAPARIN SODIUM 40 MG/0.4ML ~~LOC~~ SOLN
40.0000 mg | SUBCUTANEOUS | Status: DC
Start: 2016-02-08 — End: 2016-02-12
  Administered 2016-02-08 – 2016-02-11 (×4): 40 mg via SUBCUTANEOUS
  Filled 2016-02-08 (×4): qty 0.4

## 2016-02-08 MED ORDER — PANTOPRAZOLE SODIUM 40 MG PO TBEC
40.0000 mg | DELAYED_RELEASE_TABLET | Freq: Every day | ORAL | Status: DC
  Administered 2016-02-08 – 2016-02-12 (×5): 40 mg via ORAL
  Filled 2016-02-08 (×5): qty 1

## 2016-02-08 NOTE — Progress Notes (Signed)
ANTICOAGULATION CONSULT NOTE - Initial Consult  Pharmacy Consult for Lovenox Indication: VTE prophylaxis  Allergies  Allergen Reactions  . Codeine Nausea And Vomiting  . Bactrim [Sulfamethoxazole-Trimethoprim] Hives    Patient Measurements: Height: 4' 11.75" (151.8 cm) Weight: 188 lb (85.3 kg) IBW/kg (Calculated) : 44.93  Vital Signs: Temp: 99.4 F (37.4 C) (10/28 0420) Temp Source: Oral (10/28 0420) BP: 125/73 (10/28 0420) Pulse Rate: 105 (10/28 0420)  Labs:  Recent Labs  02/07/16 1156 02/08/16 0349  HGB 14.1 12.2  HCT 42.5 38.0  PLT 318 279  CREATININE 0.85 0.83    Estimated Creatinine Clearance: 84.3 mL/min (by C-G formula based on SCr of 0.83 mg/dL).   Medical History: Past Medical History:  Diagnosis Date  . GERD (gastroesophageal reflux disease)   . Hyperlipidemia   . PONV (postoperative nausea and vomiting)    had to use a scop patch 2nd c-sec  . Wears dentures    top    Assessment: 45 year old female s/p laparoscopic appendectomy and drainage of intra-abdominal abscess from perforated appendicitis now to start Lovenox for VTE prophylaxis per pharmacy consult.  Surgery AET was 21:42PM on 02/07/16.  CBC is within normal limits.   Goal of Therapy:  Monitor platelets by anticoagulation protocol: Yes   Plan:  Lovenox 40mg  SQ daily - 1st dose this PM.  No further adjustments needed.  Pharmacy will sign off- please re-consult if needed.   Link SnufferJessica Amaan Meyer, PharmD, BCPS Clinical Pharmacist [phone] today until 3:30PM [phone] after hours 02/08/2016,12:16 PM

## 2016-02-08 NOTE — Progress Notes (Signed)
1 Day Post-Op  Subjective: Stable postop Says she feels better and the pain is less. Tolerating sips of clear liquids without nausea Has been up to bathroom and voiding without difficulty Temp 100.4.   Objective: Vital signs in last 24 hours: Temp:  [97.2 F (36.2 C)-100.4 F (38 C)] 99.4 F (37.4 C) (10/28 0420) Pulse Rate:  [103-124] 105 (10/28 0420) Resp:  [16-20] 20 (10/27 2252) BP: (98-146)/(62-86) 125/73 (10/28 0420) SpO2:  [94 %-100 %] 96 % (10/28 0420) Last BM Date: 02/05/16  Intake/Output from previous day: 10/27 0701 - 10/28 0700 In: 3120 [P.O.:120; I.V.:1700; IV Piggyback:1300] Out: 370 [Urine:250; Drains:115; Blood:5] Intake/Output this shift: No intake/output data recorded.  General appearance: Alert and cooperative.  Minimal distress. Resp: clear to auscultation bilaterally GI: Obese.  Soft.  Appropriate tenderness.  Reddish watery drainage from drain.  No enteric contents.  Lab Results:  Results for orders placed or performed during the hospital encounter of 02/07/16 (from the past 24 hour(s))  Urinalysis, Routine w reflex microscopic     Status: Abnormal   Collection Time: 02/07/16  1:01 PM  Result Value Ref Range   Color, Urine AMBER (A) YELLOW   APPearance CLOUDY (A) CLEAR   Specific Gravity, Urine 1.021 1.005 - 1.030   pH 6.0 5.0 - 8.0   Glucose, UA NEGATIVE NEGATIVE mg/dL   Hgb urine dipstick SMALL (A) NEGATIVE   Bilirubin Urine SMALL (A) NEGATIVE   Ketones, ur >80 (A) NEGATIVE mg/dL   Protein, ur 960100 (A) NEGATIVE mg/dL   Nitrite POSITIVE (A) NEGATIVE   Leukocytes, UA SMALL (A) NEGATIVE  Urine microscopic-add on     Status: Abnormal   Collection Time: 02/07/16  1:01 PM  Result Value Ref Range   Squamous Epithelial / LPF 6-30 (A) NONE SEEN   WBC, UA 6-30 0 - 5 WBC/hpf   RBC / HPF 0-5 0 - 5 RBC/hpf   Bacteria, UA MANY (A) NONE SEEN   Casts GRANULAR CAST (A) NEGATIVE   Urine-Other MUCOUS PRESENT   POC urine preg, ED ([facility])     Status: None    Collection Time: 02/07/16  1:28 PM  Result Value Ref Range   Preg Test, Ur NEGATIVE NEGATIVE  CBC     Status: Abnormal   Collection Time: 02/08/16  3:49 AM  Result Value Ref Range   WBC 18.3 (H) 4.0 - 10.5 K/uL   RBC 4.50 3.87 - 5.11 MIL/uL   Hemoglobin 12.2 12.0 - 15.0 g/dL   HCT 45.438.0 09.836.0 - 11.946.0 %   MCV 84.4 78.0 - 100.0 fL   MCH 27.1 26.0 - 34.0 pg   MCHC 32.1 30.0 - 36.0 g/dL   RDW 14.714.0 82.911.5 - 56.215.5 %   Platelets 279 150 - 400 K/uL  Basic metabolic panel     Status: Abnormal   Collection Time: 02/08/16  3:49 AM  Result Value Ref Range   Sodium 135 135 - 145 mmol/L   Potassium 4.0 3.5 - 5.1 mmol/L   Chloride 105 101 - 111 mmol/L   CO2 21 (L) 22 - 32 mmol/L   Glucose, Bld 154 (H) 65 - 99 mg/dL   BUN 5 (L) 6 - 20 mg/dL   Creatinine, Ser 1.300.83 0.44 - 1.00 mg/dL   Calcium 8.2 (L) 8.9 - 10.3 mg/dL   GFR calc non Af Amer >60 >60 mL/min   GFR calc Af Amer >60 >60 mL/min   Anion gap 9 5 - 15  Studies/Results: Ct Abdomen Pelvis W Contrast  Result Date: 02/07/2016 CLINICAL DATA:  Evaluate for appendicitis or pancreatitis. EXAM: CT ABDOMEN AND PELVIS WITH CONTRAST TECHNIQUE: Multidetector CT imaging of the abdomen and pelvis was performed using the standard protocol following bolus administration of intravenous contrast. CONTRAST:  100 cc Isovue-300 COMPARISON:  None. FINDINGS: Lower chest: Limited visualization of the lower thorax demonstrates minimal dependent subpleural ground-glass atelectasis. No focal airspace opacities. No pleural effusion. Normal heart size. No pericardial effusion. Note is made of prominent bilateral epicardial fat pads with a prominent though nonenlarged pericardial lymph node which measures approximately 0.6 cm in greatest short axis diameter (image 11, series 2), presumably reactive in etiology. Hepatobiliary: Normal hepatic contour. Minimal amount of focal fatty infiltration adjacent to the fissure for ligamentum teres. No discrete hepatic lesions. Post  cholecystectomy. No intra or extrahepatic biliary ductal dilatation. No ascites. Pancreas: Normal appearance of the pancreas. No peripancreatic stranding. Spleen: Normal appearance of the spleen. Adrenals/Urinary Tract: There is symmetric enhancement and excretion of the bilateral kidneys. No definite renal stones on this postcontrast examination. Punctate sub cm hypo attenuating left-sided renal lesions are too small to adequately characterize though favored to represent renal cysts. No urinary obstruction or perinephric stranding. Normal appearance the bilateral adrenal glands. Normal appearance of the urinary bladder given underdistention. Stomach/Bowel: The appendix is markedly thickened measuring approximately 1.7 cm in diameter (coronal image 46, series 5) with associated moderate amount of periappendiceal stranding, findings worrisome for acute appendicitis. No evidence of perforation or definable / drainable fluid collection. Scattered colonic diverticulosis without evidence of diverticulitis. The bowel is otherwise normal in course and caliber without wall thickening or evidence of enteric obstruction. No pneumoperitoneum, pneumatosis or portal venous gas. Vascular/Lymphatic: Scattered minimal amount of mixed calcified and noncalcified atherosclerotic plaque within a normal caliber abdominal aorta. The major branch vessels of the abdominal aorta appear patent on this non CTA examination. Scattered retroperitoneal lymph nodes are numerous though individually not enlarged by size criteria with index aortocaval lymph node measuring 0.7 cm in greatest short axis diameter. No bulky retroperitoneal, mesenteric, pelvic or inguinal lymphadenopathy. Reproductive: Normal appearance of the pelvic organs. No free fluid in the pelvic cul-de-sac. Other: Small mesenteric fat containing periumbilical hernia. Regional soft tissues appear otherwise normal. Musculoskeletal: No acute or aggressive osseous abnormalities.  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 IMPRESSION: 1. Findings compatible with acute uncomplicated appendicitis. No evidence of perforation or definable / drainable fluid collection. 2. Colonic diverticulosis without evidence of diverticulitis. 3. 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 Electronically Signed   By: Simonne ComeJohn  Watts M.D.   On: 02/07/2016 14:26    . cefTRIAXone (ROCEPHIN)  IV  2 g Intravenous Q24H   And  . metronidazole  500 mg Intravenous Q8H  . nicotine  7 mg Transdermal Daily     Assessment/Plan: s/p Procedure(s): APPENDECTOMY LAPAROSCOPIC   POD#1.   Laparoscopic appendectomy and drainage of intra-abdominal abscess for perforated appendicitis with periappendiceal abscess. Continue antibiotics and drainage Allow full liquids Mobilize  DVT prophylaxis-Lovenox  History of tobacco use,  anxiety   GERD Status post cholecystectomy  @PROBHOSP @  LOS: 1 day    Seraphim Trow M 02/08/2016  . .prob

## 2016-02-09 MED ORDER — BISACODYL 10 MG RE SUPP
10.0000 mg | Freq: Two times a day (BID) | RECTAL | Status: AC
  Administered 2016-02-09 – 2016-02-10 (×2): 10 mg via RECTAL
  Filled 2016-02-09: qty 1

## 2016-02-09 NOTE — Progress Notes (Addendum)
2 Days Post-Op  Subjective: Stable and alert.  Pain less.  No stool or flatus yet.  Tolerating sips of liquids. Afebrile 24 hours. Voiding without difficulty Has ambulated a lot  Objective: Vital signs in last 24 hours: Temp:  [98.1 F (36.7 C)-98.6 F (37 C)] 98.6 F (37 C) (10/29 0417) Pulse Rate:  [87-105] 105 (10/29 0417) Resp:  [18] 18 (10/28 1430) BP: (109-113)/(64-70) 113/70 (10/29 0417) SpO2:  [91 %-92 %] 92 % (10/29 0417) Last BM Date: 02/05/16  Intake/Output from previous day: 10/28 0701 - 10/29 0700 In: 150 [IV Piggyback:150] Out: 90 [Drains:40] Intake/Output this shift: No intake/output data recorded.  General appearance: Alert.  Cooperative.  Mental status normal.  No distress Resp: Clear to auscultation and percussion bilaterally Abdomen: Soft.  Not distended.  Hypoactive bowel sounds.  Wounds clean.  Drainage is serosanguineous.  Appropriate tenderness right lower quadrant  Lab Results:  No results found for this or any previous visit (from the past 24 hour(s)).   Studies/Results: No results found.  . calcium carbonate  1 tablet Oral BID  . cefTRIAXone (ROCEPHIN)  IV  2 g Intravenous Q24H   And  . metronidazole  500 mg Intravenous Q8H  . enoxaparin (LOVENOX) injection  40 mg Subcutaneous Q24H  . nicotine  7 mg Transdermal Daily  . pantoprazole  40 mg Oral Daily     Assessment/Plan: s/p Procedure(s): APPENDECTOMY LAPAROSCOPIC   POD#2.   Laparoscopic appendectomy and drainage of intra-abdominal abscess for perforated appendicitis with periappendiceal abscess. Continue antibiotics and drainage Allow full liquids, advance slowly but as tolerated Mobilize  DVT prophylaxis-Lovenox  History of tobacco use,  anxiety   GERD Status post cholecystectomy   @PROBHOSP @  LOS: 2 days    Bernis Schreur M 02/09/2016  . .prob

## 2016-02-10 ENCOUNTER — Encounter (HOSPITAL_COMMUNITY): Payer: Self-pay | Admitting: Surgery

## 2016-02-10 LAB — CBC
HCT: 32.8 % — ABNORMAL LOW (ref 36.0–46.0)
Hemoglobin: 10.5 g/dL — ABNORMAL LOW (ref 12.0–15.0)
MCH: 27.3 pg (ref 26.0–34.0)
MCHC: 32 g/dL (ref 30.0–36.0)
MCV: 85.4 fL (ref 78.0–100.0)
Platelets: 285 10*3/uL (ref 150–400)
RBC: 3.84 MIL/uL — ABNORMAL LOW (ref 3.87–5.11)
RDW: 14.6 % (ref 11.5–15.5)
WBC: 15.7 10*3/uL — ABNORMAL HIGH (ref 4.0–10.5)

## 2016-02-10 LAB — BASIC METABOLIC PANEL
Anion gap: 7 (ref 5–15)
BUN: 5 mg/dL — ABNORMAL LOW (ref 6–20)
CO2: 25 mmol/L (ref 22–32)
Calcium: 7.8 mg/dL — ABNORMAL LOW (ref 8.9–10.3)
Chloride: 103 mmol/L (ref 101–111)
Creatinine, Ser: 0.7 mg/dL (ref 0.44–1.00)
GFR calc Af Amer: >60 mL/min (ref >60)
GFR calc non Af Amer: >60 mL/min (ref >60)
Glucose, Bld: 115 mg/dL — ABNORMAL HIGH (ref 65–99)
Potassium: 3.5 mmol/L (ref 3.5–5.1)
Sodium: 135 mmol/L (ref 135–145)

## 2016-02-10 LAB — URINALYSIS, ROUTINE W REFLEX MICROSCOPIC
Bilirubin Urine: NEGATIVE
Glucose, UA: NEGATIVE mg/dL
Hgb urine dipstick: NEGATIVE
Ketones, ur: 15 mg/dL — AB
Leukocytes, UA: NEGATIVE
Nitrite: NEGATIVE
Protein, ur: NEGATIVE mg/dL
Specific Gravity, Urine: 1.005 (ref 1.005–1.030)
pH: 6.5 (ref 5.0–8.0)

## 2016-02-10 LAB — URINE CULTURE: Culture: >=100000 — AB

## 2016-02-10 MED ORDER — HYDROMORPHONE HCL 2 MG/ML IJ SOLN: 0.2000 mg | INTRAMUSCULAR | Status: DC | PRN

## 2016-02-10 MED ORDER — TRAMADOL HCL 50 MG PO TABS: 50.0000 mg | ORAL_TABLET | Freq: Four times a day (QID) | ORAL | Status: DC | PRN

## 2016-02-10 MED ORDER — DOCUSATE SODIUM 100 MG PO CAPS
100.0000 mg | ORAL_CAPSULE | Freq: Two times a day (BID) | ORAL | Status: DC
  Administered 2016-02-10 (×2): 100 mg via ORAL
  Filled 2016-02-10 (×3): qty 1

## 2016-02-10 MED ORDER — METRONIDAZOLE 500 MG PO TABS
500.0000 mg | ORAL_TABLET | Freq: Three times a day (TID) | ORAL | Status: DC
  Administered 2016-02-10 – 2016-02-12 (×6): 500 mg via ORAL
  Filled 2016-02-10 (×6): qty 1

## 2016-02-10 MED ORDER — ACETAMINOPHEN 325 MG PO TABS
650.0000 mg | ORAL_TABLET | Freq: Four times a day (QID) | ORAL | Status: DC | PRN
  Administered 2016-02-10 – 2016-02-11 (×4): 650 mg via ORAL
  Filled 2016-02-10 (×4): qty 2

## 2016-02-10 MED ORDER — SENNA 8.6 MG PO TABS
1.0000 | ORAL_TABLET | Freq: Two times a day (BID) | ORAL | Status: DC
  Administered 2016-02-10 (×2): 8.6 mg via ORAL
  Filled 2016-02-10 (×3): qty 1

## 2016-02-10 NOTE — Progress Notes (Signed)
3 Days Post-Op  Subjective: Stable and alert.  Pain less.  No stool but starting to pass flatus.  Tolerating full liquids, denies nausea unless she takes pain medication. Afebrile. Voiding without difficulty Has ambulated a lot  Objective: Vital signs in last 24 hours: Temp:  [98.3 F (36.8 C)-98.7 F (37.1 C)] 98.3 F (36.8 C) (10/30 0532) Pulse Rate:  [101-107] 107 (10/30 0532) BP: (119-124)/(75-80) 124/80 (10/30 0532) SpO2:  [90 %-94 %] 90 % (10/30 0532) Last BM Date: 02/05/16  Intake/Output from previous day: 10/29 0701 - 10/30 0700 In: 2050.8 [P.O.:480; I.V.:1220.8; IV Piggyback:350] Out: 60 [Drains:60] Intake/Output this shift: No intake/output data recorded.  General appearance: Alert.  Cooperative.  Mental status normal.  No distress Resp: Clear to auscultation and percussion bilaterally Abdomen: Soft.  Not distended. Wounds clean.  Drainage is serosanguineous.  Appropriate tenderness right lower quadrant  Lab Results:  Results for orders placed or performed during the hospital encounter of 02/07/16 (from the past 24 hour(s))  CBC     Status: Abnormal   Collection Time: 02/10/16  4:29 AM  Result Value Ref Range   WBC 15.7 (H) 4.0 - 10.5 K/uL   RBC 3.84 (L) 3.87 - 5.11 MIL/uL   Hemoglobin 10.5 (L) 12.0 - 15.0 g/dL   HCT 16.132.8 (L) 09.636.0 - 04.546.0 %   MCV 85.4 78.0 - 100.0 fL   MCH 27.3 26.0 - 34.0 pg   MCHC 32.0 30.0 - 36.0 g/dL   RDW 40.914.6 81.111.5 - 91.415.5 %   Platelets 285 150 - 400 K/uL  Basic metabolic panel     Status: Abnormal   Collection Time: 02/10/16  4:29 AM  Result Value Ref Range   Sodium 135 135 - 145 mmol/L   Potassium 3.5 3.5 - 5.1 mmol/L   Chloride 103 101 - 111 mmol/L   CO2 25 22 - 32 mmol/L   Glucose, Bld 115 (H) 65 - 99 mg/dL   BUN 5 (L) 6 - 20 mg/dL   Creatinine, Ser 7.820.70 0.44 - 1.00 mg/dL   Calcium 7.8 (L) 8.9 - 10.3 mg/dL   GFR calc non Af Amer >60 >60 mL/min   GFR calc Af Amer >60 >60 mL/min   Anion gap 7 5 - 15     Studies/Results: No  results found.  . bisacodyl  10 mg Rectal BID  . calcium carbonate  1 tablet Oral BID  . cefTRIAXone (ROCEPHIN)  IV  2 g Intravenous Q24H   And  . metronidazole  500 mg Intravenous Q8H  . enoxaparin (LOVENOX) injection  40 mg Subcutaneous Q24H  . nicotine  7 mg Transdermal Daily  . pantoprazole  40 mg Oral Daily     Assessment/Plan: s/p Procedure(s): APPENDECTOMY LAPAROSCOPIC   POD#3.   Laparoscopic appendectomy and drainage of intra-abdominal abscess for perforated appendicitis with periappendiceal abscess. Continue antibiotics and drainage. WBC downtrending and afebrile. Recheck labs tomorrow with plan to transition to PO abx for total 7 day course.  Advance to regular diet, decrease IVF. Increase bowel reg/add colace/senna.  Change pain regimen to tylenol and ultram, with toradol/dilaudid for breakthrough due to nausea with oxycodone Mobilize  DVT prophylaxis-Lovenox  History of tobacco use,  anxiety   GERD History of cholecystectomy   Dispo- home when having bowel function and tolerating diet, improved WBC  LOS: 3 days    Tracy [name] 02/10/2016

## 2016-02-10 NOTE — Discharge Instructions (Signed)
Please arrive at least 30 min before your scheduled appointment to complete your check in paperwork.  If you are unable to arrive 30 min prior to your appointment time we may have to cancel or reschedule you.  LAPAROSCOPIC SURGERY: POST OP INSTRUCTIONS  1. DIET: Follow a light bland diet the first 24 hours after arrival home, such as soup, liquids, crackers, etc. Be sure to include lots of fluids daily. Avoid fast food or heavy meals as your are more likely to get nauseated. Eat a low fat the next few days after surgery.  2. Take your usually prescribed home medications unless otherwise directed. 3. PAIN CONTROL:  1. Pain is best controlled by a usual combination of three different methods TOGETHER:  1. Ice/Heat 2. Over the counter pain medication 3. Prescription pain medication 2. Most patients will experience some swelling and bruising around the incisions. Ice packs or heating pads (30-60 minutes up to 6 times a day) will help. Use ice for the first few days to help decrease swelling and bruising, then switch to heat to help relax tight/sore spots and speed recovery. Some people prefer to use ice alone, heat alone, alternating between ice & heat. Experiment to what works for you. Swelling and bruising can take several weeks to resolve.  3. It is helpful to take an over-the-counter pain medication regularly for the first few weeks. Choose one of the following that works best for you:  1. Naproxen (Aleve, etc) Two 220mg  tabs twice a day 2. Ibuprofen (Advil, etc) Three 200mg  tabs four times a day (every meal & bedtime) 3. Acetaminophen (Tylenol, etc) 500-650mg  four times a day (every meal & bedtime) 4. A prescription for pain medication (such as oxycodone, hydrocodone, etc) should be given to you upon discharge. Take your pain medication as prescribed.  1. If you are having problems/concerns with the prescription medicine (does not control pain, nausea, vomiting, rash, itching, etc), please call us  [phone] to see if we need to switch you to a different pain medicine that will work better for you and/or control your side effect better. 2. If you need a refill on your pain medication, please contact your pharmacy. They will contact our office to request authorization. Prescriptions will not be filled after 5 pm or on week-ends. 4. Avoid getting constipated. Between the surgery and the pain medications, it is common to experience some constipation. Increasing fluid intake and taking a fiber supplement (such as Metamucil, Citrucel, FiberCon, MiraLax, etc) 1-2 times a day regularly will usually help prevent this problem from occurring. A mild laxative (prune juice, Milk of Magnesia, MiraLax, etc) should be taken according to package directions if there are no bowel movements after 48 hours.  5. Watch out for diarrhea. If you have many loose bowel movements, simplify your diet to bland foods & liquids for a few days. Stop any stool softeners and decrease your fiber supplement. Switching to mild anti-diarrheal medications (Kayopectate, Pepto Bismol) can help. If this worsens or does not improve, please call us. 6. Wash / shower every day. You may shower over the dressings as they are waterproof. Continue to shower over incision(s) after the dressing is off. 7. Remove your waterproof bandages 5 days after surgery. You may leave the incision open to air. You may replace a dressing/Band-Aid to cover the incision for comfort if you wish.  8. ACTIVITIES as tolerated:  1. You may resume regular (light) daily activities beginning the next day--such as daily self-care, walking, climbing  stairs--gradually increasing activities as tolerated. If you can walk 30 minutes without difficulty, it is safe to try more intense activity such as jogging, treadmill, bicycling, low-impact aerobics, swimming, etc. 2. Save the most intensive and strenuous activity for last such as sit-ups, heavy lifting, contact sports, etc  Refrain from any heavy lifting or straining until you are off narcotics for pain control.  3. DO NOT PUSH THROUGH PAIN. Let pain be your guide: If it hurts to do something, don't do it. Pain is your body warning you to avoid that activity for another week until the pain goes down. 4. You may drive when you are no longer taking prescription pain medication, you can comfortably wear a seatbelt, and you can safely maneuver your car and apply brakes. 5. You may have sexual intercourse when it is comfortable.  9. FOLLOW UP in our office  1. Please call [facility] at [phone] to set up an appointment to see your surgeon in the office for a follow-up appointment approximately 2-3 weeks after your surgery. 2. Make sure that you call for this appointment the day you arrive home to insure a convenient appointment time.      10. IF YOU HAVE DISABILITY OR FAMILY LEAVE FORMS, BRING THEM TO THE               OFFICE FOR PROCESSING.   WHEN TO CALL US [phone]) [phone]:  1. Poor pain control 2. Reactions / problems with new medications (rash/itching, nausea, etc)  3. Fever over 101.5 F (38.5 C) 4. Inability to urinate 5. Nausea and/or vomiting 6. Worsening swelling or bruising 7. Continued bleeding from incision. 8. Increased pain, redness, or drainage from the incision  The clinic staff is available to answer your questions during regular business hours (8:30am-5pm). Please dont hesitate to call and ask to speak to one of our nurses for clinical concerns.  If you have a medical emergency, go to the nearest emergency room or call 911.  A surgeon from [facility] is always on call at [facility]   [facility] [place]  [address], [address], [place], [place] [address] ?  MAIN: [phone]) [phone] ? TOLL FREE: [phone] ?  FAX [phone]) [phone]  [email or web address]

## 2016-02-11 ENCOUNTER — Encounter (HOSPITAL_COMMUNITY): Payer: Self-pay | Admitting: Student

## 2016-02-11 LAB — BASIC METABOLIC PANEL
Anion gap: 6 (ref 5–15)
BUN: 5 mg/dL — ABNORMAL LOW (ref 6–20)
CO2: 26 mmol/L (ref 22–32)
Calcium: 7.9 mg/dL — ABNORMAL LOW (ref 8.9–10.3)
Chloride: 106 mmol/L (ref 101–111)
Creatinine, Ser: 0.53 mg/dL (ref 0.44–1.00)
GFR calc Af Amer: >60 mL/min (ref >60)
GFR calc non Af Amer: >60 mL/min (ref >60)
Glucose, Bld: 106 mg/dL — ABNORMAL HIGH (ref 65–99)
Potassium: 3.3 mmol/L — ABNORMAL LOW (ref 3.5–5.1)
Sodium: 138 mmol/L (ref 135–145)

## 2016-02-11 LAB — CBC
HCT: 32.8 % — ABNORMAL LOW (ref 36.0–46.0)
Hemoglobin: 10.5 g/dL — ABNORMAL LOW (ref 12.0–15.0)
MCH: 26.6 pg (ref 26.0–34.0)
MCHC: 32 g/dL (ref 30.0–36.0)
MCV: 83 fL (ref 78.0–100.0)
Platelets: 335 10*3/uL (ref 150–400)
RBC: 3.95 MIL/uL (ref 3.87–5.11)
RDW: 14.1 % (ref 11.5–15.5)
WBC: 15 10*3/uL — ABNORMAL HIGH (ref 4.0–10.5)

## 2016-02-11 LAB — URINALYSIS, ROUTINE W REFLEX MICROSCOPIC
Bilirubin Urine: NEGATIVE
Glucose, UA: NEGATIVE mg/dL
Hgb urine dipstick: NEGATIVE
Ketones, ur: 40 mg/dL — AB
Leukocytes, UA: NEGATIVE
Nitrite: NEGATIVE
Protein, ur: NEGATIVE mg/dL
Specific Gravity, Urine: 1.012 (ref 1.005–1.030)
pH: 7.5 (ref 5.0–8.0)

## 2016-02-11 MED ORDER — SACCHAROMYCES BOULARDII 250 MG PO CAPS
250.0000 mg | ORAL_CAPSULE | Freq: Two times a day (BID) | ORAL | Status: DC
  Administered 2016-02-11 – 2016-02-12 (×3): 250 mg via ORAL
  Filled 2016-02-11 (×3): qty 1

## 2016-02-11 NOTE — Progress Notes (Signed)
4 Days Post-Op  Subjective: She wants to go home, having very watery diarrhea now, not the normal loose stools she has with constipation - diarrhea, most common since her cholecystectomy. Tolerating diet.  Drainage is still cloudy serous fluid.  Objective: Vital signs in last 24 hours: Temp:  [98 F (36.7 C)-98.3 F (36.8 C)] 98.3 F (36.8 C) (10/31 0900) Pulse Rate:  [82-98] 89 (10/31 0900) Resp:  [18] 18 (10/31 0620) BP: (110-129)/(56-74) 110/74 (10/31 0620) SpO2:  [94 %-98 %] 95 % (10/31 0900) Last BM Date: 02/10/16 240 PO recorded Voided x 7 Drain 200 BM x 7 Afebrile, VSS K+ 3.3 WBC still 15K  Intake/Output from previous day: 10/30 0701 - 10/31 0700 In: 240 [P.O.:240] Out: 200 [Drains:200] Intake/Output this shift: No intake/output data recorded.  General appearance: alert, cooperative and no distress Resp: clear to auscultation bilaterally GI: soft, sore, + BS, multiple loose stools.  Sites all look good.  Lab Results:   Recent Labs  02/10/16 0429 02/11/16 0551  WBC 15.7* 15.0*  HGB 10.5* 10.5*  HCT 32.8* 32.8*  PLT 285 335    BMET  Recent Labs  02/10/16 0429 02/11/16 0551  NA 135 138  K 3.5 3.3*  CL 103 106  CO2 25 26  GLUCOSE 115* 106*  BUN 5* 5*  CREATININE 0.70 0.53  CALCIUM 7.8* 7.9*   PT/INR No results for input(s): LABPROT, INR in the last 72 hours.   Recent Labs Lab 02/07/16 1156  AST 18  ALT 18  ALKPHOS 54  BILITOT 1.0  PROT 7.1  ALBUMIN 3.8     Lipase     Component Value Date/Time   LIPASE 20 02/07/2016 1156     Studies/Results: No results found. Prior to Admission medications   Medication Sig Start Date End Date Taking? Authorizing Provider  citalopram (CELEXA) 10 MG tablet Take 1 tablet (10 mg total) by mouth daily. 01/30/16  Yes Johna Sheriffarol L Vincent, MD  ibuprofen (ADVIL,MOTRIN) 200 MG tablet Take 200 mg by mouth every 6 (six) hours as needed for moderate pain.   Yes Historical Provider, MD  naproxen (NAPROSYN) 500  MG tablet Take 1 tablet (500 mg total) by mouth 2 (two) times daily with a meal. Patient taking differently: Take 500 mg by mouth 2 (two) times daily as needed for moderate pain.  06/23/14  Yes Mary-Margaret Daphine DeutscherMartin, FNP  omeprazole (PRILOSEC) 20 MG capsule Take 1 capsule (20 mg total) by mouth daily. 01/30/16  Yes Johna Sheriffarol L Vincent, MD   Specimen Description URINE, RANDOM   Special Requests NONE   Culture >=100,000 COLONIES/mL ESCHERICHIA COLI    Report Status 02/10/2016 FINAL   Organism ID, Bacteria ESCHERICHIA COLI    Resulting Agency [facility]  Susceptibility    Escherichia coli    MIC    AMPICILLIN 16 INTERMED... Intermediate    AMPICILLIN/SULBACTAM 4 SENSITIVE  Sensitive    CEFAZOLIN <=4 SENSITIVE "><=4 SENSITIVE  Sensitive    CEFTRIAXONE <=1 SENSITIVE "><=1 SENSITIVE  Sensitive    CIPROFLOXACIN >=4 RESISTANT  Resistant    Extended ESBL NEGATIVE  Sensitive    GENTAMICIN <=1 SENSITIVE "><=1 SENSITIVE  Sensitive    IMIPENEM <=0.25 SENSITIVE "><=0.25 SENS... Sensitive    NITROFURANTOIN <=16 SENSITIVE "><=16 SENSIT... Sensitive    PIP/TAZO <=4 SENSITIVE "><=4 SENSITIVE  Sensitive    TRIMETH/SULFA <=20 SENSITIVE "><=20 SENSIT... Sensitive         Susceptibility Comments   Escherichia coli  >=100,000 COLONIES/mL ESCHERICHIA COLI  Medications: . calcium carbonate  1 tablet Oral BID  . cefTRIAXone (ROCEPHIN)  IV  2 g Intravenous Q24H  . docusate sodium  100 mg Oral BID  . enoxaparin (LOVENOX) injection  40 mg Subcutaneous Q24H  . metroNIDAZOLE  500 mg Oral Q8H  . nicotine  7 mg Transdermal Daily  . pantoprazole  40 mg Oral Daily  . senna  1 tablet Oral BID   . sodium chloride 75 mL/hr at 02/10/16 2315     S/p cholecystectomy Hyperlipidemia Hx of tobacco use Anxiety GERD  Assessment/Plan Perforated appendicitis with periappendiceal abscess. Laparoscopic appendectomy and drainage of intra-abdominal abscess 02/07/16 Dr. Maisie Fushomas Cornett POD4 Loose stools -  stopping colace and Sen[place]  E coli  UTI  - repeat UTI OK FEN: IV fluids/regular diet ID: day 4 Ceftriaxone/Metronidazole DVT:  Lovenox  Plan:  WBC 18.3 on admit, 15.7 yesterday and 15K today.  Drainage is cloudy.  I will review with Dr. Corliss Skainssuei. Add probiotic.        LOS: 4 days    Tracy [name] 02/11/2016 [phone]

## 2016-02-12 ENCOUNTER — Encounter (INDEPENDENT_AMBULATORY_CARE_PROVIDER_SITE_OTHER): Payer: Self-pay | Admitting: General Surgery

## 2016-02-12 LAB — BASIC METABOLIC PANEL
Anion gap: 8 (ref 5–15)
BUN: <5 mg/dL — ABNORMAL LOW (ref 6–20)
CO2: 25 mmol/L (ref 22–32)
Calcium: 8 mg/dL — ABNORMAL LOW (ref 8.9–10.3)
Chloride: 105 mmol/L (ref 101–111)
Creatinine, Ser: 0.56 mg/dL (ref 0.44–1.00)
GFR calc Af Amer: >60 mL/min (ref >60)
GFR calc non Af Amer: >60 mL/min (ref >60)
Glucose, Bld: 124 mg/dL — ABNORMAL HIGH (ref 65–99)
Potassium: 3.3 mmol/L — ABNORMAL LOW (ref 3.5–5.1)
Sodium: 138 mmol/L (ref 135–145)

## 2016-02-12 LAB — CBC
HCT: 32.2 % — ABNORMAL LOW (ref 36.0–46.0)
Hemoglobin: 10.3 g/dL — ABNORMAL LOW (ref 12.0–15.0)
MCH: 26.4 pg (ref 26.0–34.0)
MCHC: 32 g/dL (ref 30.0–36.0)
MCV: 82.6 fL (ref 78.0–100.0)
Platelets: 356 10*3/uL (ref 150–400)
RBC: 3.9 MIL/uL (ref 3.87–5.11)
RDW: 14.4 % (ref 11.5–15.5)
WBC: 14.6 10*3/uL — ABNORMAL HIGH (ref 4.0–10.5)

## 2016-02-12 MED ORDER — AMOXICILLIN-POT CLAVULANATE 875-125 MG PO TABS: 1.0000 | ORAL_TABLET | Freq: Two times a day (BID) | ORAL | 0 refills | Status: DC

## 2016-02-12 MED ORDER — AMOXICILLIN-POT CLAVULANATE 875-125 MG PO TABS: 1.0000 | ORAL_TABLET | Freq: Two times a day (BID) | ORAL | 0 refills | Status: AC

## 2016-02-12 MED ORDER — POTASSIUM CHLORIDE CRYS ER 20 MEQ PO TBCR
20.0000 meq | EXTENDED_RELEASE_TABLET | Freq: Two times a day (BID) | ORAL | Status: DC
  Administered 2016-02-12: 20 meq via ORAL
  Filled 2016-02-12: qty 1

## 2016-02-12 MED ORDER — AMOXICILLIN-POT CLAVULANATE 875-125 MG PO TABS
1.0000 | ORAL_TABLET | Freq: Two times a day (BID) | ORAL | Status: DC
  Administered 2016-02-12: 1 via ORAL
  Filled 2016-02-12: qty 1

## 2016-02-12 MED ORDER — TRAMADOL HCL 50 MG PO TABS: 50.0000 mg | ORAL_TABLET | Freq: Four times a day (QID) | ORAL | 0 refills | Status: DC | PRN

## 2016-02-12 MED ORDER — IBUPROFEN 200 MG PO TABS: 600.0000 mg | ORAL_TABLET | Freq: Four times a day (QID) | ORAL | Status: DC | PRN

## 2016-02-12 NOTE — Progress Notes (Signed)
D/C instructions reviewed with pt by SWAT RN, copy of instructions given to pt. Pt provided with dressings and tape to change dsg to where JP drain was removed. dsg changed prior to discharge.  Pt instructed how to change dsg prn drainage . Pt d/c'd via wheelchair with belongings with family, escorted by unit NT.

## 2016-02-12 NOTE — Progress Notes (Signed)
5 Days Post-Op  Subjective: She remains sore, but is over all doing well.  She still wants to go home .  Drainage is clearer this AM.  Sites all look good.  Objective: Vital signs in last 24 hours: Temp:  [97.8 F (36.6 C)-98.4 F (36.9 C)] 98.4 F (36.9 C) (11/01 0400) Pulse Rate:  [81-95] 95 (11/01 0400) Resp:  [16] 16 (11/01 0400) BP: (116-129)/(66-77) 129/75 (11/01 0400) SpO2:  [96 %-97 %] 97 % (11/01 0400) Last BM Date: 02/11/16  PO not recorded Voided x 5 Stool x 4 Afebrile, VSS WBC trending down K+3.3  Intake/Output from previous day: 10/31 0701 - 11/01 0700 In: 1306.3 [I.V.:1256.3; IV Piggyback:50] Out: 125 [Drains:125] Intake/Output this shift: No intake/output data recorded.  General appearance: alert, cooperative and no distress Resp: clear to auscultation bilaterally GI: soft, sore, sites look fine + BS, diarrhea is better.  Lab Results:   Recent Labs  02/11/16 0551 02/12/16 0631  WBC 15.0* 14.6*  HGB 10.5* 10.3*  HCT 32.8* 32.2*  PLT 335 356    BMET  Recent Labs  02/11/16 0551 02/12/16 0631  NA 138 138  K 3.3* 3.3*  CL 106 105  CO2 26 25  GLUCOSE 106* 124*  BUN 5* <5*  CREATININE 0.53 0.56  CALCIUM 7.9* 8.0*   PT/INR No results for input(s): LABPROT, INR in the last 72 hours.   Recent Labs Lab 02/07/16 1156  AST 18  ALT 18  ALKPHOS 54  BILITOT 1.0  PROT 7.1  ALBUMIN 3.8     Lipase     Component Value Date/Time   LIPASE 20 02/07/2016 1156     Studies/Results: No results found.  Medications: . amoxicillin-clavulanate  1 tablet Oral Q12H  . calcium carbonate  1 tablet Oral BID  . enoxaparin (LOVENOX) injection  40 mg Subcutaneous Q24H  . nicotine  7 mg Transdermal Daily  . pantoprazole  40 mg Oral Daily  . saccharomyces boulardii  250 mg Oral BID    Assessment/Plan Perforated appendicitis with periappendiceal abscess. Laparoscopic appendectomy and drainage of intra-abdominal abscess 02/07/16 Dr. Maisie Fushomas Cornett  POD 5 Loose stools - improving E coli  UTI  - repeat UTI OK Hypokalemia  - replace  FEN: IV fluids/regular diet ID: day 5 Ceftriaxone/Metronidazole completed 02/11/16, starting Augmentin this AM 02/12/16. DVT:  Lovenox  Plan:  Starting Augmentin, will discuss when to send home and if we want to leave drain with Dr. Corliss Skainssuei. If we keep her till AM, recheck CBC in AM.      LOS: 5 days    Birdena Kingma 02/12/2016 [phone]

## 2016-02-14 NOTE — Discharge Summary (Signed)
[facility] Discharge Summary   Patient ID: Tracy [name] MRN: [id number] DOB/AGE: November 23, 1970 45 y.o.  Admit date: 02/07/2016 Discharge date: 02/14/2016  Admitting Diagnosis: Perforated appendicitis  Discharge Diagnosis Patient Active Problem List   Diagnosis Date Noted  . Acute appendicitis 02/07/2016  . Perforated appendicitis 02/07/2016  . GERD (gastroesophageal reflux disease) 01/30/2016  . Tobacco abuse counseling 01/30/2016  . BMI 38.0-38.9,adult 01/30/2016  . Adjustment disorder with anxious mood 01/30/2016  . Post-operative state 07/28/2013  . Gallstones and inflammation of gallbladder without obstruction 06/16/2013   Consultants None   Imaging: 02/07/16 CT Abd/Pelvis w/ contrast:  1. Findings compatible with acute uncomplicated appendicitis. No evidence of perforation or definable / drainable fluid collection. 2. Colonic diverticulosis without evidence of diverticulitis. 3. 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  Procedures Dr. Maisie Fushomas Cornett (02/07/16) - Laparoscopic Appendectomy with drainage of intraabdominal abscess, 19 round drain placed  Hospital Course:  45 y/o female [facility] 2 days of abdominal pain associated with vomiting. Workup showed WBC 25.3 and possible UTI on U/A. CT scan as above.  Patient was admitted and underwent procedure listed above. Tolerated procedure well and was transferred to the floor.  Diet was advanced as tolerated.  She received IV antibiotics throughout her hospital stay. On POD#5, the patient was voiding well, tolerating diet, ambulating well, pain well controlled, vital signs stable, incisions c/d/i and felt stable for discharge home. Her drain was removed prior to discharge. She will take an additional 7 days of PO Augmentin at discharge. Patient will follow up in our office in 2 weeks and knows to call with questions or concerns.        Medication List    STOP taking these medications   naproxen 500 MG tablet Commonly known as:  NAPROSYN     TAKE these medications   amoxicillin-clavulanate 875-125 MG tablet Commonly known as:  AUGMENTIN Take 1 tablet by mouth every 12 (twelve) hours.   citalopram 10 MG tablet Commonly known as:  CELEXA Take 1 tablet (10 mg total) by mouth daily.   ibuprofen 200 MG tablet Commonly known as:  ADVIL,MOTRIN Take 200 mg by mouth every 6 (six) hours as needed for moderate pain.   omeprazole 20 MG capsule Commonly known as:  PRILOSEC Take 1 capsule (20 mg total) by mouth daily.      Follow-up Information    CORNETT,THOMAS A., MD. Schedule an appointment as soon as possible for a visit in 2 week(s).   Specialty:  General Surgery Why:  for post-operative follow up  Contact information: [address] [place] [address] [phone]           Signed: Hosie Spanglelizabeth Simaan, [facility] 02/14/2016, 4:13 PM Pager: [phone] Consults: [phone] Mon-Fri 7:00 am-4:30 pm Sat-Sun 7:00 am-11:30 am

## 2016-03-26 ENCOUNTER — Other Ambulatory Visit: Payer: Self-pay | Admitting: *Deleted

## 2016-03-26 DIAGNOSIS — K219 Gastro-esophageal reflux disease without esophagitis: Secondary | ICD-10-CM

## 2016-03-26 MED ORDER — OMEPRAZOLE 20 MG PO CPDR: 20.0000 mg | DELAYED_RELEASE_CAPSULE | Freq: Every day | ORAL | 1 refills | Status: DC

## 2016-04-10 ENCOUNTER — Telehealth: Payer: Self-pay | Admitting: Family Medicine

## 2016-04-10 NOTE — Telephone Encounter (Signed)
Patient states that the celexa is making patient grint her teeth so she stopped taking it. Patient would like to try something else. Please advice.

## 2016-04-10 NOTE — Telephone Encounter (Signed)
Please come in for visit next week and we can discuss other options and person

## 2016-04-14 NOTE — Telephone Encounter (Signed)
Patient will call back to schedule an appointment with Dr. Oswaldo DoneVincent to discuss further options on medication

## 2016-04-20 ENCOUNTER — Telehealth: Payer: Self-pay | Admitting: Family Medicine

## 2016-04-20 MED ORDER — SERTRALINE HCL 50 MG PO TABS: 50.0000 mg | ORAL_TABLET | Freq: Every day | ORAL | 3 refills | Status: DC

## 2016-04-20 NOTE — Telephone Encounter (Signed)
That can happen sometimes with this kind of medication. It can goes away after 4 weeks. I can switch it but it might have the same affect. She can try taking half a tab for a week and see if that helps. If symptoms are significant enough that she cannot continue medication at all you can call in sertraline 50mg  #30 tabs with 1 refill.

## 2016-04-20 NOTE — Telephone Encounter (Signed)
Pt aware and she says she would rather try the zoloft since she has dentures and has already caused sore spots on her gums. Zoloft sent into the pharmacy per the pt request.

## 2016-08-21 ENCOUNTER — Other Ambulatory Visit: Payer: Self-pay | Admitting: Pediatrics

## 2016-09-23 ENCOUNTER — Other Ambulatory Visit: Payer: Self-pay | Admitting: Family Medicine

## 2016-10-27 ENCOUNTER — Other Ambulatory Visit: Payer: Self-pay | Admitting: Pediatrics

## 2016-10-28 NOTE — Telephone Encounter (Signed)
[facility]07/16  [facility]

## 2016-10-28 NOTE — Telephone Encounter (Signed)
Patient needs an appointment to discuss this, we instructed her to do that on the last time she called. She needs to be seen before refills.

## 2016-10-28 NOTE — Telephone Encounter (Signed)
Dr. Louanne Skyeettinger, you may have been the last touch on this one. Passing ti on to you

## 2016-10-28 NOTE — Telephone Encounter (Signed)
[id number] 07/18

## 2016-10-31 ENCOUNTER — Ambulatory Visit (INDEPENDENT_AMBULATORY_CARE_PROVIDER_SITE_OTHER): Payer: BLUE CROSS/BLUE SHIELD | Admitting: Family Medicine

## 2016-10-31 ENCOUNTER — Encounter: Payer: Self-pay | Admitting: Family Medicine

## 2016-10-31 VITALS — BP 116/88 | HR 100 | Ht 59.75 in | Wt 184.0 lb

## 2016-10-31 DIAGNOSIS — J441 Chronic obstructive pulmonary disease with (acute) exacerbation: Secondary | ICD-10-CM | POA: Diagnosis not present

## 2016-10-31 MED ORDER — AZITHROMYCIN 250 MG PO TABS: ORAL_TABLET | ORAL | 0 refills | Status: DC

## 2016-10-31 MED ORDER — ALBUTEROL SULFATE HFA 108 (90 BASE) MCG/ACT IN AERS: 2.0000 | INHALATION_SPRAY | Freq: Four times a day (QID) | RESPIRATORY_TRACT | 0 refills | Status: DC | PRN

## 2016-10-31 MED ORDER — SERTRALINE HCL 50 MG PO TABS: 50.0000 mg | ORAL_TABLET | Freq: Every day | ORAL | 0 refills | Status: DC

## 2016-10-31 NOTE — Progress Notes (Signed)
BP 116/88 (BP Location: Left Arm)   Pulse 100   Ht 4' 11.75" (1.518 m)   Wt 184 lb (83.5 kg)   LMP 08/31/2016   BMI 36.24 kg/m    Subjective:    Patient ID: Tracy [name], female    DOB: 03-09-1971, 45 y.o.   MRN: [id number]  HPI: Tracy [name] is a 46 y.o. female presenting on 10/31/2016 for Cough and Nasal Congestion   HPI Cough and nasal congestion and chest congestion and wheezing Patient comes in today complaining of one week's worth of cough and nasal congestion and chest congestion and wheezing. She has been trying to use some over-the-counter stuff such as Mucinex to help with that but that did not seem to be clearing it. She does admit that she still smokes about 1 pack per day and had tried to quit before but started back up again. She has intentions to quit and wants to try to get on her own with patches that she bought over-the-counter. She denies any fevers or chills or shortness of breath. Her daughter is ill with a viral-like infection/cold and is seeing [place] today as well.  Relevant past medical, surgical, family and social history reviewed and updated as indicated. Interim medical history since our last visit reviewed. Allergies and medications reviewed and updated.  Review of Systems  Constitutional: Negative for chills and fever.  HENT: Positive for congestion, postnasal drip, rhinorrhea, sinus pressure, sneezing and sore throat. Negative for ear discharge and ear pain.   Eyes: Negative for pain, redness and visual disturbance.  Respiratory: Positive for cough and wheezing. Negative for chest tightness and shortness of breath.   Cardiovascular: Negative for chest pain and leg swelling.  Genitourinary: Negative for difficulty urinating and dysuria.  Musculoskeletal: Negative for back pain and gait problem.  Skin: Negative for rash.  Neurological: Negative for light-headedness and headaches.  Psychiatric/Behavioral: Negative for agitation and behavioral  problems.  All other systems reviewed and are negative.   Per HPI unless specifically indicated above        Objective:    BP 116/88 (BP Location: Left Arm)   Pulse 100   Ht 4' 11.75" (1.518 m)   Wt 184 lb (83.5 kg)   LMP 08/31/2016   BMI 36.24 kg/m   Wt Readings from Last 3 Encounters:  10/31/16 184 lb (83.5 kg)  02/07/16 188 lb (85.3 kg)  02/07/16 188 lb 3.2 oz (85.4 kg)    Physical Exam  Constitutional: She is oriented to person, place, and time. She appears well-developed and well-nourished. No distress.  HENT:  Right Ear: Tympanic membrane, external ear and ear canal normal.  Left Ear: Tympanic membrane, external ear and ear canal normal.  Nose: Mucosal edema and rhinorrhea present. No epistaxis. Right sinus exhibits no maxillary sinus tenderness and no frontal sinus tenderness. Left sinus exhibits no maxillary sinus tenderness and no frontal sinus tenderness.  Mouth/Throat: Uvula is midline and mucous membranes are normal. Posterior oropharyngeal edema and posterior oropharyngeal erythema present. No oropharyngeal exudate or tonsillar abscesses.  Eyes: Conjunctivae and EOM are normal.  Neck: Neck supple. No thyromegaly present.  Cardiovascular: Normal rate, regular rhythm, normal heart sounds and intact distal pulses.   No murmur heard. Pulmonary/Chest: Effort normal. No respiratory distress. She has wheezes. She has no rales.  Musculoskeletal: Normal range of motion.  Lymphadenopathy:    She has no cervical adenopathy.  Neurological: She is alert and oriented to person, place, and time. Coordination normal.  Skin: Skin is warm and dry. No rash noted. She is not diaphoretic.  Psychiatric: She has a normal mood and affect. Her behavior is normal.  Vitals reviewed.       Assessment & Plan:   Problem List Items Addressed This Visit    None    Visit Diagnoses    COPD exacerbation (HCC)    -  Primary   Relevant Medications   albuterol (PROVENTIL HFA;VENTOLIN  HFA) 108 (90 Base) MCG/ACT inhaler   azithromycin (ZITHROMAX) 250 MG tablet       Follow up plan: Return in about 4 weeks (around 11/28/2016), or if symptoms worsen or fail to improve, for Follow-up anxiety and depression with PCP.  Counseling provided for all of the vaccine components No orders of the defined types were placed in this encounter.   Arville CareJoshua Dettinger, MD [facility] 10/31/2016, 11:52 AM

## 2016-12-04 ENCOUNTER — Telehealth: Payer: Self-pay | Admitting: Pediatrics

## 2016-12-04 DIAGNOSIS — K219 Gastro-esophageal reflux disease without esophagitis: Secondary | ICD-10-CM

## 2016-12-04 MED ORDER — SERTRALINE HCL 50 MG PO TABS: 50.0000 mg | ORAL_TABLET | Freq: Every day | ORAL | 0 refills | Status: DC

## 2016-12-04 MED ORDER — OMEPRAZOLE 20 MG PO CPDR: 20.0000 mg | DELAYED_RELEASE_CAPSULE | Freq: Every day | ORAL | 0 refills | Status: DC

## 2016-12-04 NOTE — Telephone Encounter (Signed)
Please review and advise.

## 2016-12-23 ENCOUNTER — Ambulatory Visit: Payer: BLUE CROSS/BLUE SHIELD | Admitting: Pediatrics

## 2017-02-17 ENCOUNTER — Ambulatory Visit: Payer: BLUE CROSS/BLUE SHIELD | Admitting: Pediatrics

## 2017-02-17 ENCOUNTER — Encounter: Payer: Self-pay | Admitting: Pediatrics

## 2017-02-17 VITALS — BP 114/72 | HR 72 | Temp 97.2°F | Ht 59.75 in | Wt 190.8 lb

## 2017-02-17 DIAGNOSIS — F4322 Adjustment disorder with anxiety: Secondary | ICD-10-CM | POA: Diagnosis not present

## 2017-02-17 DIAGNOSIS — K219 Gastro-esophageal reflux disease without esophagitis: Secondary | ICD-10-CM | POA: Diagnosis not present

## 2017-02-17 DIAGNOSIS — R062 Wheezing: Secondary | ICD-10-CM | POA: Diagnosis not present

## 2017-02-17 DIAGNOSIS — Z716 Tobacco abuse counseling: Secondary | ICD-10-CM | POA: Diagnosis not present

## 2017-02-17 MED ORDER — SERTRALINE HCL 50 MG PO TABS: 150.0000 mg | ORAL_TABLET | Freq: Every day | ORAL | 2 refills | Status: DC

## 2017-02-17 MED ORDER — ALBUTEROL SULFATE HFA 108 (90 BASE) MCG/ACT IN AERS: 2.0000 | INHALATION_SPRAY | Freq: Four times a day (QID) | RESPIRATORY_TRACT | 1 refills | Status: DC | PRN

## 2017-02-17 MED ORDER — OMEPRAZOLE 20 MG PO CPDR: 20.0000 mg | DELAYED_RELEASE_CAPSULE | Freq: Every day | ORAL | 0 refills | Status: DC

## 2017-02-17 MED ORDER — OMEPRAZOLE 20 MG PO CPDR: 20.0000 mg | DELAYED_RELEASE_CAPSULE | Freq: Every day | ORAL | 1 refills | Status: DC

## 2017-02-17 MED ORDER — ALBUTEROL SULFATE HFA 108 (90 BASE) MCG/ACT IN AERS: 2.0000 | INHALATION_SPRAY | Freq: Four times a day (QID) | RESPIRATORY_TRACT | 0 refills | Status: DC | PRN

## 2017-02-17 NOTE — Progress Notes (Signed)
  Subjective:   Patient ID: Tracy [name], female    DOB: 09-17-1970, 46 y.o.   MRN: [id number] CC: Medication Refill; Trouble sleeping; and Hot Flashes  HPI: Tracy [name] is a 46 y.o. female presenting for Medication Refill; Trouble sleeping; and Hot Flashes  Wheezing episode in July, no trouble since then Rarely needing albuterol  Insomnia: has a hard time falling asleep In bed 9-12a, up at 6a No naps Drinks 4-5 cups of coffee a day, usually before 1pm  Hot flashes she thinks keeping her awake  Tobacco use: ongoing Tried chantix, felt nauseous 24h a day on it wellbutrin caused extreme irritability Smoking inside the house Open to decreasing amount  Mood has been down still Mom with lung cancer, father died this past April Feels safe at home, no thoughts of self harm  Relevant past medical, surgical, family and social history reviewed. Allergies and medications reviewed and updated. Social History   Tobacco Use  Smoking Status Current Every Day Smoker  . Packs/day: 1.00  Smokeless Tobacco Never Used   ROS: Per HPI   Objective:    BP 114/72   Pulse 72   Temp (!) 97.2 F (36.2 C) (Oral)   Ht 4' 11.75" (1.518 m)   Wt 190 lb 12.8 oz (86.5 kg)   BMI 37.58 kg/m   Wt Readings from Last 3 Encounters:  02/17/17 190 lb 12.8 oz (86.5 kg)  10/31/16 184 lb (83.5 kg)  02/07/16 188 lb (85.3 kg)    Gen: NAD, alert, cooperative with exam, NCAT EYES: EOMI, no conjunctival injection, or no icterus ENT:  R TM dull, L TM pearly gray b/l, OP without erythema LYMPH: no cervical LAD CV: NRRR, normal S1/S2, no murmur, distal pulses 2+ b/l Resp: CTABL, no wheezes, normal WOB Abd: +BS, soft, NTND.  Ext: No edema, warm Neuro: Alert and oriented, strength equal b/l UE and LE, coordination grossly normal MSK: normal muscle bulk Psych: normal affect  Assessment & Plan:  Tracy [name] was seen today for medication refill, trouble sleeping and hot flashes.  Diagnoses and all orders  for this visit:  Adjustment disorder with anxious mood Ongoing symptoms Increase to 100mg  for 2 weeks, then 150mg  if still ongoing symptoms -     sertraline (ZOLOFT) 50 MG tablet; Take 3 tablets (150 mg total) daily by mouth.  Hot flashes SSRI as above, let me know if symptoms not improving  Wheezing Use below prn If needing regularly needs to be seen -     albuterol (PROVENTIL HFA;VENTOLIN HFA) 108 (90 Base) MCG/ACT inhaler; Inhale 2 puffs every 6 (six) hours as needed into the lungs for wheezing or shortness of breath.  Gastroesophageal reflux disease, esophagitis presence not specified -     omeprazole (PRILOSEC) 20 MG capsule; Take 1 capsule (20 mg total) daily by mouth.  Tobacco abuse counseling Discussed strategies for cessation Has failed wellbutrin/chantix in the past  Follow up plan: Return in about 3 months (around 05/20/2017). Tracy Krasarol Vincent, MD [facility]

## 2017-03-06 ENCOUNTER — Other Ambulatory Visit: Payer: Self-pay | Admitting: Pediatrics

## 2017-03-15 ENCOUNTER — Encounter: Payer: BLUE CROSS/BLUE SHIELD | Admitting: Pediatrics

## 2017-03-16 ENCOUNTER — Encounter: Payer: Self-pay | Admitting: Pediatrics

## 2017-05-24 ENCOUNTER — Other Ambulatory Visit: Payer: Self-pay | Admitting: Pediatrics

## 2017-05-24 DIAGNOSIS — F4322 Adjustment disorder with anxiety: Secondary | ICD-10-CM

## 2017-07-26 ENCOUNTER — Other Ambulatory Visit: Payer: Self-pay | Admitting: Pediatrics

## 2017-07-27 NOTE — Telephone Encounter (Signed)
Last seen 04/29/16

## 2017-08-06 ENCOUNTER — Telehealth: Payer: Self-pay | Admitting: Pediatrics

## 2017-08-06 MED ORDER — SERTRALINE HCL 50 MG PO TABS: 150.0000 mg | ORAL_TABLET | Freq: Every day | ORAL | 2 refills | Status: DC

## 2017-08-06 NOTE — Telephone Encounter (Signed)
She is to take 150 mg every day.  Okay to take all 3 tablets at the same time, may do better with it that way.  She does not have to take it 3 times during the day.  I resent in the prescription to 150 mg each day.

## 2017-08-06 NOTE — Telephone Encounter (Signed)
Patient aware and verbalized understanding. °

## 2017-08-06 NOTE — Telephone Encounter (Signed)
PT states that she is suppose to be taking the zoloft 3 times a day but [facility] is telling her that the refill they have on file says once daily and they won't refill it, pt is wanting to know if we can contact [facility] to get this fixed. If you call the pt and she doesn't answer please leave a message on her VM

## 2017-08-06 NOTE — Telephone Encounter (Signed)
lmtcb

## 2017-09-01 ENCOUNTER — Ambulatory Visit (INDEPENDENT_AMBULATORY_CARE_PROVIDER_SITE_OTHER): Payer: BLUE CROSS/BLUE SHIELD | Admitting: Pediatrics

## 2017-09-01 ENCOUNTER — Encounter: Payer: Self-pay | Admitting: Pediatrics

## 2017-09-01 VITALS — BP 113/75 | HR 85 | Temp 97.0°F | Ht 59.75 in | Wt 192.2 lb

## 2017-09-01 DIAGNOSIS — F419 Anxiety disorder, unspecified: Secondary | ICD-10-CM

## 2017-09-01 DIAGNOSIS — Z13220 Encounter for screening for lipoid disorders: Secondary | ICD-10-CM

## 2017-09-01 DIAGNOSIS — R635 Abnormal weight gain: Secondary | ICD-10-CM

## 2017-09-01 DIAGNOSIS — D649 Anemia, unspecified: Secondary | ICD-10-CM

## 2017-09-01 MED ORDER — FLUOXETINE HCL 20 MG PO TABS: 20.0000 mg | ORAL_TABLET | Freq: Every day | ORAL | 3 refills | Status: DC

## 2017-09-01 NOTE — Patient Instructions (Signed)
Take one tab sertraline for 1 week Then stop and start 1 cap of prozac

## 2017-09-01 NOTE — Progress Notes (Signed)
  Subjective:   Patient ID: Tracy [name], female    DOB: 1970/11/17, 47 y.o.   MRN: [id number] CC: Medical Management of Chronic Issues  HPI: Tracy [name] is a 47 y.o. female   Mother died in 2023/03/06. Goes to sleep ok, wakes up frequently.  Anemia: Last period 13 months ago roughly.  No bleeding since then.  Anxiety: Ongoing symptoms.  Teeth grinding with the celexa.  She was taking 150 mg of sertraline.  Decreased to 100 mg a couple months ago when she had episode of diarrhea.  Not helping enough with her mood.  Weight gain and elevated BMI: Not regularly exercising.  Trying to avoid sugary foods.  Relevant past medical, surgical, family and social history reviewed. Allergies and medications reviewed and updated. Social History   Tobacco Use  Smoking Status Current Every Day Smoker  . Packs/day: 1.00  Smokeless Tobacco Never Used   ROS: Per HPI   Objective:    BP 113/75   Pulse 85   Temp (!) 97 F (36.1 C) (Oral)   Ht 4' 11.75" (1.518 m)   Wt 192 lb 3.2 oz (87.2 kg)   BMI 37.85 kg/m   Wt Readings from Last 3 Encounters:  09/01/17 192 lb 3.2 oz (87.2 kg)  02/17/17 190 lb 12.8 oz (86.5 kg)  10/31/16 184 lb (83.5 kg)    Gen: NAD, alert, cooperative with exam, NCAT EYES: EOMI, no conjunctival injection, or no icterus ENT:  TMs pearly gray b/l, OP without erythema LYMPH: no cervical LAD CV: NRRR, normal S1/S2, no murmur, distal pulses 2+ b/l Resp: CTABL, no wheezes, normal WOB Abd: +BS, soft, NTND. no guarding or organomegaly Ext: No edema, warm Neuro: Alert and oriented, strength equal b/l UE and LE, coordination grossly normal MSK: normal muscle bulk Anxiety: Normal affect  Assessment & Plan:  Tracy [name] was seen today for medical management of chronic issues.  Diagnoses and all orders for this visit:  Anxiety Ongoing symptoms.  Will taper off of sertraline, start fluoxetine.  If any side effects let me know. -     FLUoxetine (PROZAC) 20 MG tablet; Take 1  tablet (20 mg total) by mouth daily.  Anemia, unspecified type Not having periods, will recheck CBC -     CBC with Differential/Platelet; Future  Screening for hyperlipidemia -     Lipid panel; Future  Weight gain Elevated BMI Discussed lifestyle changes, daily exercise -     BMP8+EGFR; Future -     TSH; Future   Follow up plan: Return in about 2 months (around 11/01/2017). Assunta Found, MD [place]

## 2017-09-02 ENCOUNTER — Telehealth: Payer: Self-pay | Admitting: Clinical

## 2017-09-02 NOTE — Telephone Encounter (Signed)
Referral by Dr. Oswaldo Done to [facility].  TC to Ms. Scala, no answer.  This [facility] specialist left message to call back with name and contact information.

## 2017-09-08 ENCOUNTER — Telehealth: Payer: Self-pay | Admitting: Licensed Clinical Social Worker

## 2017-09-24 ENCOUNTER — Other Ambulatory Visit: Payer: Self-pay | Admitting: Pediatrics

## 2017-09-24 DIAGNOSIS — F419 Anxiety disorder, unspecified: Secondary | ICD-10-CM

## 2017-10-07 ENCOUNTER — Telehealth: Payer: Self-pay

## 2017-10-07 NOTE — Telephone Encounter (Signed)
[facility] - Left Msg 

## 2017-10-12 ENCOUNTER — Telehealth: Payer: Self-pay

## 2017-10-12 NOTE — Telephone Encounter (Signed)
[facility] - Writer has made multiple attempts to contact the patient on 09/01/2017; 09/02/2017; 09/08/2017; 6/2/72019 and 10/12/2017.   Writer will place the patient on the inactive list.    Writer informed the PCP and Dr. Hisada through staff messaging that the patient will be placed on the inactive list. 

## 2017-11-27 ENCOUNTER — Other Ambulatory Visit: Payer: Self-pay | Admitting: Pediatrics

## 2017-11-27 DIAGNOSIS — K219 Gastro-esophageal reflux disease without esophagitis: Secondary | ICD-10-CM

## 2017-12-29 ENCOUNTER — Other Ambulatory Visit: Payer: Self-pay | Admitting: Pediatrics

## 2017-12-29 DIAGNOSIS — F419 Anxiety disorder, unspecified: Secondary | ICD-10-CM

## 2018-01-05 ENCOUNTER — Ambulatory Visit: Payer: BLUE CROSS/BLUE SHIELD | Admitting: Family Medicine

## 2018-02-10 DIAGNOSIS — M9903 Segmental and somatic dysfunction of lumbar region: Secondary | ICD-10-CM | POA: Diagnosis not present

## 2018-02-10 DIAGNOSIS — M62830 Muscle spasm of back: Secondary | ICD-10-CM | POA: Diagnosis not present

## 2018-02-10 DIAGNOSIS — M9901 Segmental and somatic dysfunction of cervical region: Secondary | ICD-10-CM | POA: Diagnosis not present

## 2018-02-16 DIAGNOSIS — M9903 Segmental and somatic dysfunction of lumbar region: Secondary | ICD-10-CM | POA: Diagnosis not present

## 2018-02-16 DIAGNOSIS — M62830 Muscle spasm of back: Secondary | ICD-10-CM | POA: Diagnosis not present

## 2018-02-16 DIAGNOSIS — M9901 Segmental and somatic dysfunction of cervical region: Secondary | ICD-10-CM | POA: Diagnosis not present

## 2018-02-17 DIAGNOSIS — M9903 Segmental and somatic dysfunction of lumbar region: Secondary | ICD-10-CM | POA: Diagnosis not present

## 2018-02-17 DIAGNOSIS — M62830 Muscle spasm of back: Secondary | ICD-10-CM | POA: Diagnosis not present

## 2018-02-17 DIAGNOSIS — M9901 Segmental and somatic dysfunction of cervical region: Secondary | ICD-10-CM | POA: Diagnosis not present

## 2018-02-18 ENCOUNTER — Other Ambulatory Visit: Payer: BLUE CROSS/BLUE SHIELD

## 2018-02-18 DIAGNOSIS — Z13220 Encounter for screening for lipoid disorders: Secondary | ICD-10-CM | POA: Diagnosis not present

## 2018-02-18 DIAGNOSIS — R635 Abnormal weight gain: Secondary | ICD-10-CM | POA: Diagnosis not present

## 2018-02-18 DIAGNOSIS — D649 Anemia, unspecified: Secondary | ICD-10-CM | POA: Diagnosis not present

## 2018-02-19 LAB — BMP8+EGFR
BUN/Creatinine Ratio: 7 — ABNORMAL LOW (ref 9–23)
BUN: 6 mg/dL (ref 6–24)
CO2: 27 mmol/L (ref 20–29)
Calcium: 9.5 mg/dL (ref 8.7–10.2)
Chloride: 100 mmol/L (ref 96–106)
Creatinine, Ser: 0.87 mg/dL (ref 0.57–1.00)
GFR calc Af Amer: 93 mL/min/{1.73_m2} (ref >59)
GFR calc non Af Amer: 81 mL/min/{1.73_m2} (ref >59)
Glucose: 99 mg/dL (ref 65–99)
Potassium: 4.9 mmol/L (ref 3.5–5.2)
Sodium: 139 mmol/L (ref 134–144)

## 2018-02-19 LAB — LIPID PANEL
Chol/HDL Ratio: 4.6 ratio — ABNORMAL HIGH (ref 0.0–4.4)
Cholesterol, Total: 212 mg/dL — ABNORMAL HIGH (ref 100–199)
HDL: 46 mg/dL (ref >39)
LDL Calculated: 133 mg/dL — ABNORMAL HIGH (ref 0–99)
Triglycerides: 165 mg/dL — ABNORMAL HIGH (ref 0–149)
VLDL Cholesterol Cal: 33 mg/dL (ref 5–40)

## 2018-02-19 LAB — CBC WITH DIFFERENTIAL/PLATELET
Basophils Absolute: 0.1 10*3/uL (ref 0.0–0.2)
Basos: 1 %
EOS (ABSOLUTE): 0.2 10*3/uL (ref 0.0–0.4)
Eos: 2 %
Hematocrit: 39.4 % (ref 34.0–46.6)
Hemoglobin: 12.9 g/dL (ref 11.1–15.9)
Immature Grans (Abs): 0 10*3/uL (ref 0.0–0.1)
Immature Granulocytes: 0 %
Lymphocytes Absolute: 3 10*3/uL (ref 0.7–3.1)
Lymphs: 32 %
MCH: 26.3 pg — ABNORMAL LOW (ref 26.6–33.0)
MCHC: 32.7 g/dL (ref 31.5–35.7)
MCV: 80 fL (ref 79–97)
Monocytes Absolute: 0.5 10*3/uL (ref 0.1–0.9)
Monocytes: 5 %
Neutrophils Absolute: 5.6 10*3/uL (ref 1.4–7.0)
Neutrophils: 60 %
Platelets: 398 10*3/uL (ref 150–450)
RBC: 4.91 x10E6/uL (ref 3.77–5.28)
RDW: 14.2 % (ref 12.3–15.4)
WBC: 9.3 10*3/uL (ref 3.4–10.8)

## 2018-02-19 LAB — TSH: TSH: 0.656 u[IU]/mL (ref 0.450–4.500)

## 2018-02-21 DIAGNOSIS — M62830 Muscle spasm of back: Secondary | ICD-10-CM | POA: Diagnosis not present

## 2018-02-21 DIAGNOSIS — M9903 Segmental and somatic dysfunction of lumbar region: Secondary | ICD-10-CM | POA: Diagnosis not present

## 2018-02-21 DIAGNOSIS — M9901 Segmental and somatic dysfunction of cervical region: Secondary | ICD-10-CM | POA: Diagnosis not present

## 2018-02-23 DIAGNOSIS — M62830 Muscle spasm of back: Secondary | ICD-10-CM | POA: Diagnosis not present

## 2018-02-23 DIAGNOSIS — M9903 Segmental and somatic dysfunction of lumbar region: Secondary | ICD-10-CM | POA: Diagnosis not present

## 2018-02-23 DIAGNOSIS — M9901 Segmental and somatic dysfunction of cervical region: Secondary | ICD-10-CM | POA: Diagnosis not present

## 2018-02-28 DIAGNOSIS — M9901 Segmental and somatic dysfunction of cervical region: Secondary | ICD-10-CM | POA: Diagnosis not present

## 2018-02-28 DIAGNOSIS — M62830 Muscle spasm of back: Secondary | ICD-10-CM | POA: Diagnosis not present

## 2018-02-28 DIAGNOSIS — M9903 Segmental and somatic dysfunction of lumbar region: Secondary | ICD-10-CM | POA: Diagnosis not present

## 2018-03-02 DIAGNOSIS — M62830 Muscle spasm of back: Secondary | ICD-10-CM | POA: Diagnosis not present

## 2018-03-02 DIAGNOSIS — M9903 Segmental and somatic dysfunction of lumbar region: Secondary | ICD-10-CM | POA: Diagnosis not present

## 2018-03-02 DIAGNOSIS — M9901 Segmental and somatic dysfunction of cervical region: Secondary | ICD-10-CM | POA: Diagnosis not present

## 2018-03-03 DIAGNOSIS — M9903 Segmental and somatic dysfunction of lumbar region: Secondary | ICD-10-CM | POA: Diagnosis not present

## 2018-03-03 DIAGNOSIS — M62830 Muscle spasm of back: Secondary | ICD-10-CM | POA: Diagnosis not present

## 2018-03-03 DIAGNOSIS — M9901 Segmental and somatic dysfunction of cervical region: Secondary | ICD-10-CM | POA: Diagnosis not present

## 2018-03-04 ENCOUNTER — Other Ambulatory Visit: Payer: Self-pay | Admitting: *Deleted

## 2018-03-04 DIAGNOSIS — F419 Anxiety disorder, unspecified: Secondary | ICD-10-CM

## 2018-03-04 MED ORDER — FLUOXETINE HCL 20 MG PO TABS: 20.0000 mg | ORAL_TABLET | Freq: Every day | ORAL | 0 refills | Status: DC

## 2018-03-26 ENCOUNTER — Other Ambulatory Visit: Payer: Self-pay | Admitting: Family Medicine

## 2018-03-26 DIAGNOSIS — F419 Anxiety disorder, unspecified: Secondary | ICD-10-CM

## 2018-04-01 ENCOUNTER — Encounter: Payer: Self-pay | Admitting: Family Medicine

## 2018-04-01 ENCOUNTER — Ambulatory Visit (INDEPENDENT_AMBULATORY_CARE_PROVIDER_SITE_OTHER): Payer: BLUE CROSS/BLUE SHIELD | Admitting: Family Medicine

## 2018-04-01 VITALS — BP 108/67 | HR 82 | Temp 97.7°F | Ht 59.5 in | Wt 191.0 lb

## 2018-04-01 DIAGNOSIS — B360 Pityriasis versicolor: Secondary | ICD-10-CM | POA: Diagnosis not present

## 2018-04-01 DIAGNOSIS — K219 Gastro-esophageal reflux disease without esophagitis: Secondary | ICD-10-CM | POA: Diagnosis not present

## 2018-04-01 DIAGNOSIS — F4322 Adjustment disorder with anxiety: Secondary | ICD-10-CM | POA: Diagnosis not present

## 2018-04-01 DIAGNOSIS — N951 Menopausal and female climacteric states: Secondary | ICD-10-CM | POA: Diagnosis not present

## 2018-04-01 DIAGNOSIS — Z720 Tobacco use: Secondary | ICD-10-CM

## 2018-04-01 MED ORDER — GABAPENTIN 100 MG PO CAPS: 100.0000 mg | ORAL_CAPSULE | Freq: Every day | ORAL | 0 refills | Status: DC

## 2018-04-01 MED ORDER — KETOCONAZOLE 2 % EX SHAM: 1.0000 "application " | MEDICATED_SHAMPOO | CUTANEOUS | 0 refills | Status: DC

## 2018-04-01 MED ORDER — FLUOXETINE HCL 40 MG PO CAPS: 40.0000 mg | ORAL_CAPSULE | Freq: Every day | ORAL | 0 refills | Status: DC

## 2018-04-01 MED ORDER — OMEPRAZOLE 20 MG PO CPDR: 20.0000 mg | DELAYED_RELEASE_CAPSULE | Freq: Every day | ORAL | 1 refills | Status: DC

## 2018-04-01 NOTE — Progress Notes (Signed)
Subjective: CC: Depression, hot flashes, rash PCP: Tracy Ip, DO ZOX:WRUEA J [name] is a 47 y.o. female presenting to clinic today for:  1. Depression History: long standing history.  Exacerbated after passing of her mother and father last year.  Previously treated with Celexa (stopped due to side effects), Zoloft (flat affect on). No history of hospitalization for mental health disorder.  Patient reports that she was started on Prozac 20 mg this past summer.  She notes that in the first couple of weeks it seemed to work extremely well but has since leveled off.  She is interested in pursuing a higher dose of this.  She describes depressive symptoms as anhedonia/lack of interest.  She has thought about counseling services but is unable to go through this secondary to work and family obligations.  No SI, HI.  She takes melatonin in efforts to sleep.  She is able to get to sleep but not able to stay asleep most days.  Family history significant for bipolar disorder, generalized anxiety disorder and depression.  SocHx notable for tobacco use disorder.  She smokes daily.  No alcohol use or drug use.  2.  Hot flashes Patient went through menopause about 2 years ago.  She notes that she has had issues with hot flashes and wonders if there is anything that we can do about this.  3.  Rash Patient reports that she has a rash that started this past summer on her chest neck and scalp.  She has had similar in the past and treated it with a topical shampoo.  She is wondering if we can do another treatment since this has returned.  Rash is somewhat itchy.  4.  Acid reflux Patient reports longstanding history of GERD.  Denies any history of authorizations.  No hematemesis, hematochezia or melena.  Symptoms are well controlled with omeprazole daily.  ROS: Per HPI  Allergies  Allergen Reactions  . Codeine Nausea And Vomiting  . Bactrim [Sulfamethoxazole-Trimethoprim] Hives  . Wellbutrin  [Bupropion]     extreme irritability   Past Medical History:  Diagnosis Date  . GERD (gastroesophageal reflux disease)   . Hyperlipidemia   . PONV (postoperative nausea and vomiting)    had to use a scop patch 2nd c-sec  . Wears dentures    top    Current Outpatient Medications:  .  FLUoxetine (PROZAC) 20 MG tablet, Take 1 tablet (20 mg total) by mouth daily. (Needs to be seen before next refill), Disp: 30 tablet, Rfl: 0 .  ibuprofen (ADVIL,MOTRIN) 200 MG tablet, Take 200 mg by mouth every 6 (six) hours as needed for moderate pain., Disp: , Rfl:  .  omeprazole (PRILOSEC) 20 MG capsule, TAKE 1 CAPSULE (20 MG TOTAL) DAILY BY MOUTH., Disp: 90 capsule, Rfl: 1 .  albuterol (PROVENTIL HFA;VENTOLIN HFA) 108 (90 Base) MCG/ACT inhaler, Inhale 2 puffs every 6 (six) hours as needed into the lungs for wheezing or shortness of breath. (Patient not taking: Reported on 04/01/2018), Disp: 1 Inhaler, Rfl: 1 Social History   Socioeconomic History  . Marital status: Single    Spouse name: Not on file  . Number of children: Not on file  . Years of education: Not on file  . Highest education level: Not on file  Occupational History  . Not on file  Social Needs  . Financial resource strain: Not on file  . Food insecurity:    Worry: Not on file    Inability: Not on file  .  Transportation needs:    Medical: Not on file    Non-medical: Not on file  Tobacco Use  . Smoking status: Current Every Day Smoker    Packs/day: 1.00  . Smokeless tobacco: Never Used  Substance and Sexual Activity  . Alcohol use: No    Comment: rare  . Drug use: No  . Sexual activity: Yes  Lifestyle  . Physical activity:    Days per week: Not on file    Minutes per session: Not on file  . Stress: Not on file  Relationships  . Social connections:    Talks on phone: Not on file    Gets together: Not on file    Attends religious service: Not on file    Active member of club or organization: Not on file    Attends  meetings of clubs or organizations: Not on file    Relationship status: Not on file  . Intimate partner violence:    Fear of current or ex partner: Not on file    Emotionally abused: Not on file    Physically abused: Not on file    Forced sexual activity: Not on file  Other Topics Concern  . Not on file  Social History Narrative  . Not on file   Family History  Problem Relation Age of Onset  . COPD Mother   . Heart disease Mother   . Heart disease Father   . Diabetes Father   . Heart disease Brother   . Breast cancer Unknown        aunt  . Colon cancer Unknown        great uncle  . Lung cancer Unknown        uncle  . Esophageal cancer Unknown        grandfather    Objective: Office vital signs reviewed. BP 108/67   Pulse 82   Temp 97.7 F (36.5 C) (Oral)   Ht 4' 11.5" (1.511 m)   Wt 191 lb (86.6 kg)   BMI 37.93 kg/m   Physical Examination:  General: Awake, alert, well nourished, No acute distress HEENT: Normal, sclera white, MMM Cardio: regular rate and rhythm, S1S2 heard, no murmurs appreciated Pulm: clear to auscultation bilaterally, no wheezes, rhonchi or rales; normal work of breathing on room air Skin: dry; intact; patches of irregular hyperpigmentation noted along the chest, neck.  No evidence of secondary  bacterial infection. Psych: Mood stable, speech normal, affect appropriate, pleasant and interactive.  Does not appear to be responding to internal stimuli.  Eye contact is fair. Depression screen [facility] 2/9 04/01/2018 09/01/2017 02/17/2017  Decreased Interest 2 3 2   Down, Depressed, Hopeless 2 1 0  PHQ - 2 Score 4 4 2   Altered sleeping 2 3 3   Tired, decreased energy 2 2 2   Change in appetite 0 0 0  Feeling bad or failure about yourself  1 1 0  Trouble concentrating 0 1 0  Moving slowly or fidgety/restless 0 0 0  Suicidal thoughts 0 0 0  PHQ-9 Score 9 11 7   Difficult doing work/chores Somewhat difficult Somewhat difficult Somewhat difficult   GAD 7 :  Generalized Anxiety Score 04/01/2018  Nervous, Anxious, on Edge 1  Control/stop worrying 1  Worry too much - different things 2  Trouble relaxing 1  Restless 1  Easily annoyed or irritable 2  Afraid - awful might happen 1  Total GAD 7 Score 9  Anxiety Difficulty Somewhat difficult    Assessment/ Plan: 47  y.o. female   1. Adjustment disorder with anxious mood PHQ 9 score slightly better than previous in May.  Gad 7 score slightly elevated.  We have increased her fluoxetine to 40 mg daily.  We will recheck in 6 to 8 weeks.  If symptoms are persistent, could consider adding Wellbutrin, which would help with smoking cessation.  If anxiety symptoms are persistent, could consider adding BuSpar.  Would like to avoid sedating medications in this patient who still works outside of the home and has obligations with her children. - FLUoxetine (PROZAC) 40 MG capsule; Take 1 capsule (40 mg total) by mouth daily.  Dispense: 90 capsule; Refill: 0  2. Gastroesophageal reflux disease without esophagitis Omeprazole refilled. - omeprazole (PRILOSEC) 20 MG capsule; Take 1 capsule (20 mg total) by mouth daily.  Dispense: 90 capsule; Refill: 1  3. Hot flashes due to menopause Trial of gabapentin 100 mg nightly for hot flashes.  We discussed starting this medication in about 1 week so as to allow herself time to adjust to the 40 mg dose of Prozac.  She will follow-up with me on an as-needed basis for this. - gabapentin (NEURONTIN) 100 MG capsule; Take 1 capsule (100 mg total) by mouth at bedtime.  Dispense: 30 capsule; Refill: 0  4. Tinea versicolor Ketoconazole shampoo prescribed.  Apply to affected areas, leave on for 10 minutes then rinse off twice weekly for the next 2 weeks. - ketoconazole (NIZORAL) 2 % shampoo; Apply 1 application topically 2 (two) times a week. (May leave on for 10 minutes then rinse off) x2 weeks  Dispense: 120 mL; Refill: 0  5. Tobacco use Contemplative.  We will discuss this  further at next visit.   No orders of the defined types were placed in this encounter.  Meds ordered this encounter  Medications  . ketoconazole (NIZORAL) 2 % shampoo    Sig: Apply 1 application topically 2 (two) times a week. (May leave on for 10 minutes then rinse off) x2 weeks    Dispense:  120 mL    Refill:  0  . FLUoxetine (PROZAC) 40 MG capsule    Sig: Take 1 capsule (40 mg total) by mouth daily.    Dispense:  90 capsule    Refill:  0  . omeprazole (PRILOSEC) 20 MG capsule    Sig: Take 1 capsule (20 mg total) by mouth daily.    Dispense:  90 capsule    Refill:  1  . gabapentin (NEURONTIN) 100 MG capsule    Sig: Take 1 capsule (100 mg total) by mouth at bedtime.    Dispense:  30 capsule    Refill:  0     Tracy [name] SkainsM Conor Filsaime, DO [facility] [phone]

## 2018-04-01 NOTE — Patient Instructions (Signed)
We have increased your fluoxetine dose to 40 mg daily.  You may double up on your 20 mg tablets for now and then switch over to the 40 mg capsules once your current bottle is finished.  You may see some mild GI upset with increase of dose but this should resolve after about 10 days.  Follow-up with me in about 6 weeks for recheck.  Your omeprazole has been resent for your acid reflux.  Start gabapentin 100 mg every night at bedtime in about 1 week.  Allow yourself to make sure you are tolerating the increased dose of fluoxetine before starting.  For your rash, you may start using the ketoconazole shampoo.  Apply to the affected areas, leave on for 10 minutes then rinse off.  Do this twice per week for the next 2 weeks and then discontinue.  Taking the medicine as directed and not missing any doses is one of the best things you can do to treat your depression.  Here are some things to keep in mind:  1) Side effects (stomach upset, some increased anxiety) may happen before you notice a benefit.  These side effects typically go away over time. 2) Changes to your dose of medicine or a change in medication all together is sometimes necessary 3) Most people need to be on medication at least 12 months 4) Many people will notice an improvement within two weeks but the full effect of the medication can take up to 4-6 weeks 5) Stopping the medication when you start feeling better often results in a return of symptoms 6) Never discontinue your medication without contacting a health care professional first.  Some medications require gradual discontinuation/ taper and can make you sick if you stop them abruptly.  If your symptoms worsen or you have thoughts of suicide/homicide, PLEASE SEEK IMMEDIATE MEDICAL ATTENTION.  You may always call:  National Suicide Hotline: 215-764-1527251-440-0617 [place] Crisis Line: 603-759-22976615283857 [facility] [place]: [phone]   These are available 24 hours a day, 7  days a week.

## 2018-04-24 ENCOUNTER — Other Ambulatory Visit: Payer: Self-pay | Admitting: Family Medicine

## 2018-04-24 DIAGNOSIS — N951 Menopausal and female climacteric states: Secondary | ICD-10-CM

## 2018-05-18 ENCOUNTER — Ambulatory Visit: Payer: BLUE CROSS/BLUE SHIELD | Admitting: Family Medicine

## 2018-06-21 ENCOUNTER — Other Ambulatory Visit: Payer: Self-pay | Admitting: Pediatrics

## 2018-06-21 DIAGNOSIS — R062 Wheezing: Secondary | ICD-10-CM

## 2018-06-28 ENCOUNTER — Other Ambulatory Visit: Payer: Self-pay | Admitting: Family Medicine

## 2018-06-28 DIAGNOSIS — F4322 Adjustment disorder with anxiety: Secondary | ICD-10-CM

## 2018-08-10 IMAGING — CT CT ABD-PELV W/ CM
2 of 5 series · 15 of 46 positions shown, 17 images · IV contrast (isovue)
Comparison: None.

CLINICAL DATA: Evaluate for appendicitis or pancreatitis.

EXAM:
CT ABDOMEN AND PELVIS WITH CONTRAST
TECHNIQUE: Multidetector CT imaging of the abdomen and pelvis was performed
using the standard protocol following bolus administration of
intravenous contrast.
CONTRAST:  100 cc Isovue-4PP

[Series 2: abd/ pelvis 5.0 i30f 1 · axial · 0.96mm/px · z∈[+688,+1138]mm · 12 of 102 slices shown, 14 images]
[im 6/102  soft-tissue]
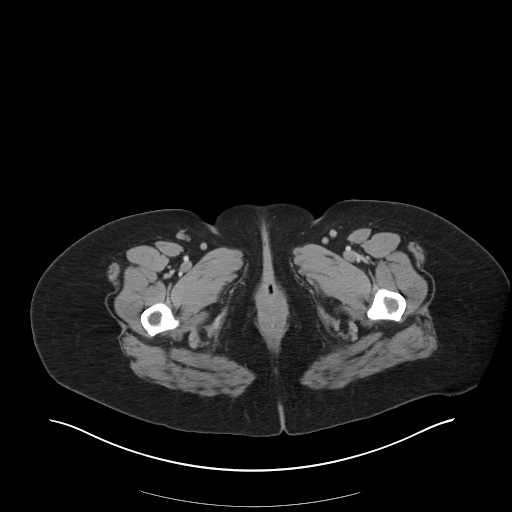
[im 6/102  bone]
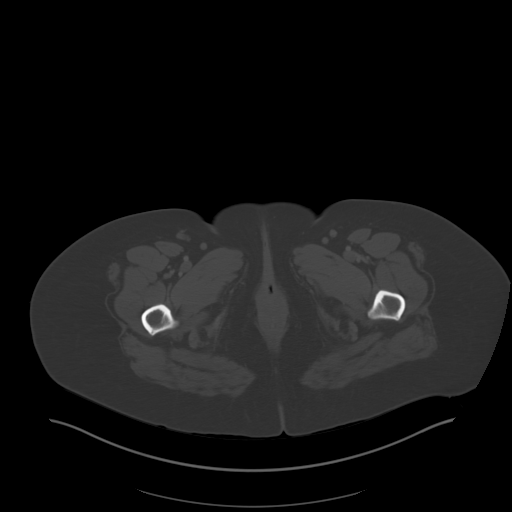
[im 16/102  soft-tissue]
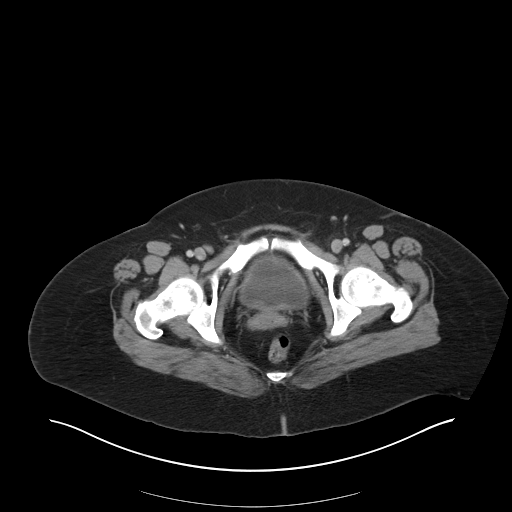
[im 21/102  soft-tissue]
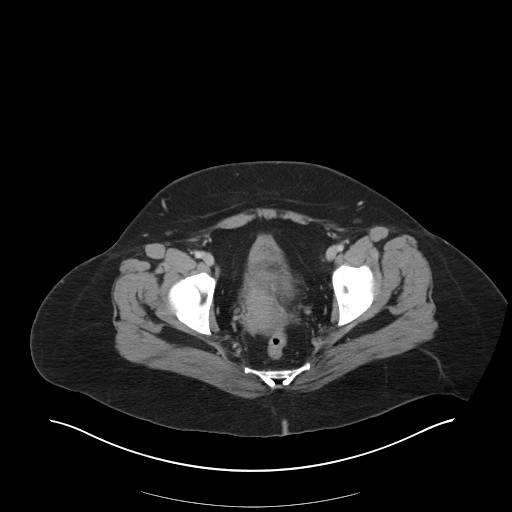
[im 31/102  soft-tissue]
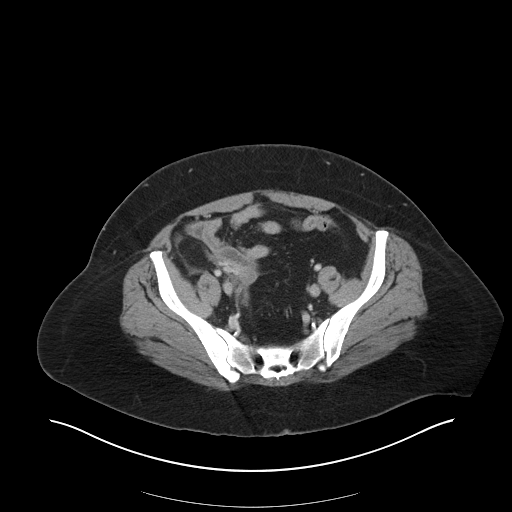
[im 41/102  soft-tissue]
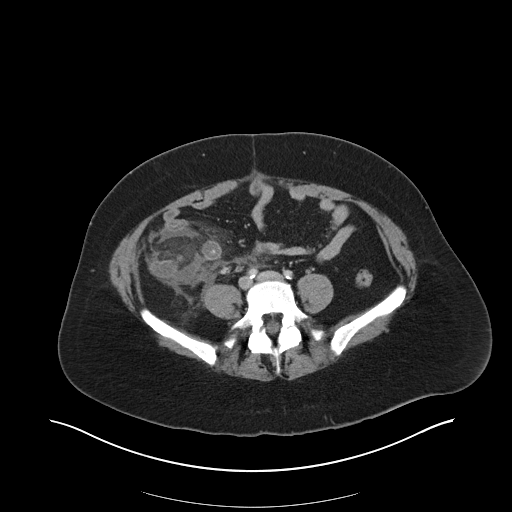
[im 46/102  soft-tissue]
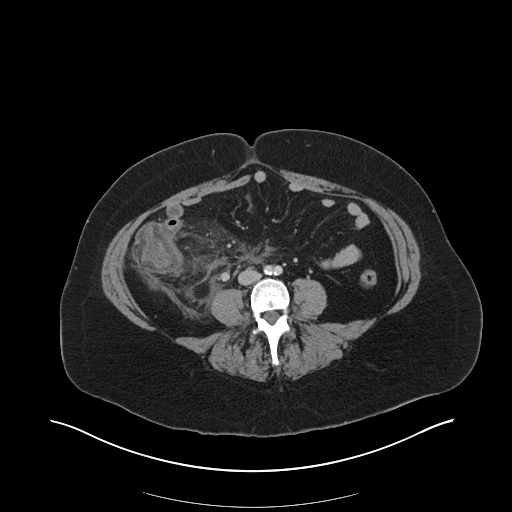
[im 56/102  soft-tissue]
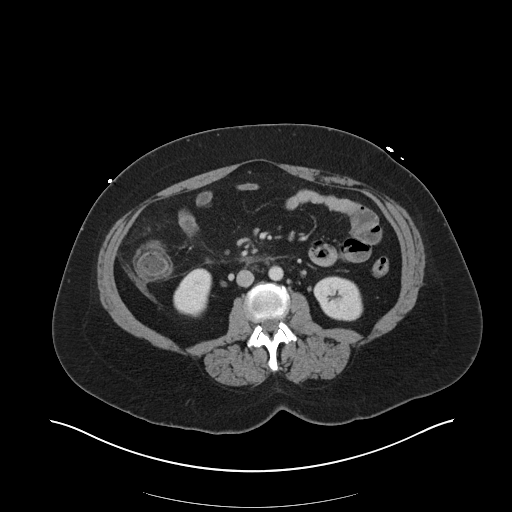
[im 61/102  soft-tissue]
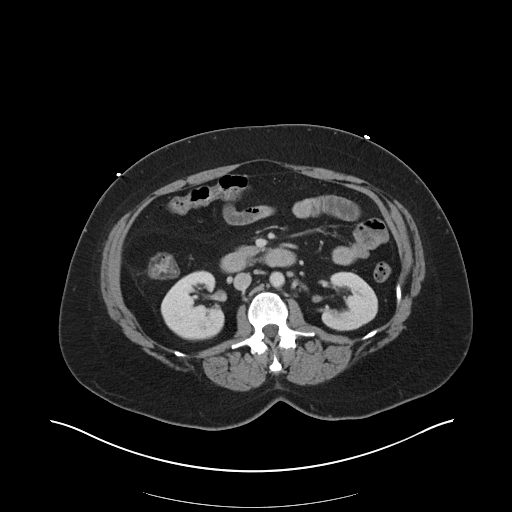
[im 71/102  soft-tissue]
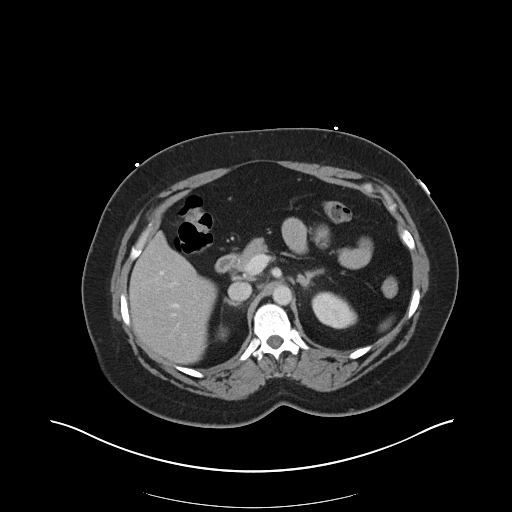
[im 71/102  bone]
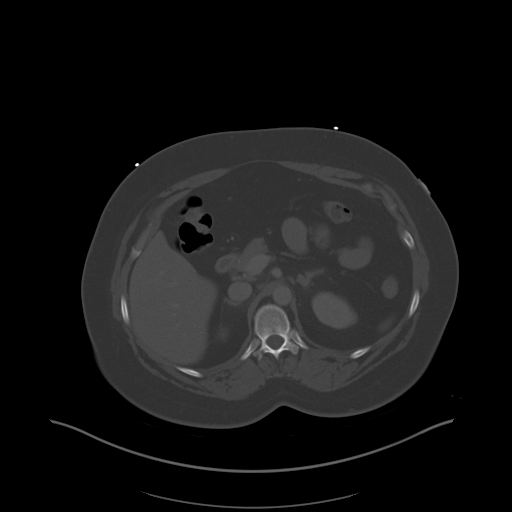
[im 81/102  soft-tissue]
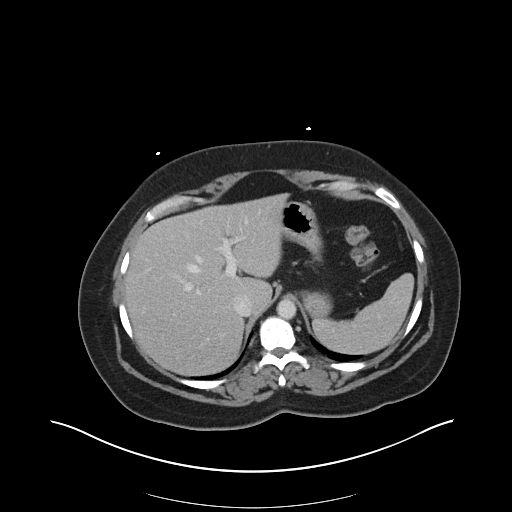
[im 86/102  soft-tissue]
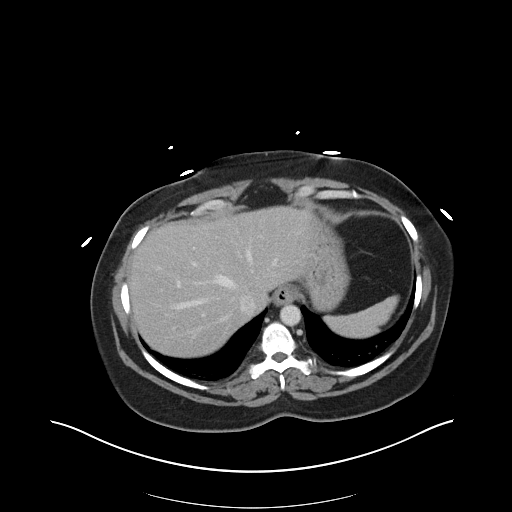
[im 96/102  soft-tissue]
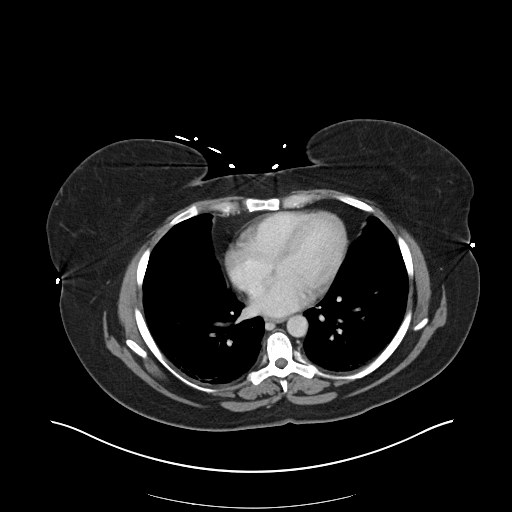

[Series 5: coronal soft tissue · coronal · 0.81mm/px · 3 of 103 slices shown]
[im 35/103  soft-tissue]
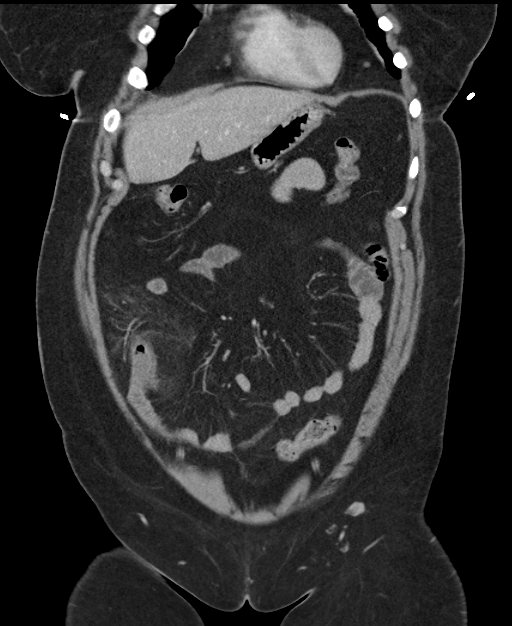
[im 46/103  soft-tissue]
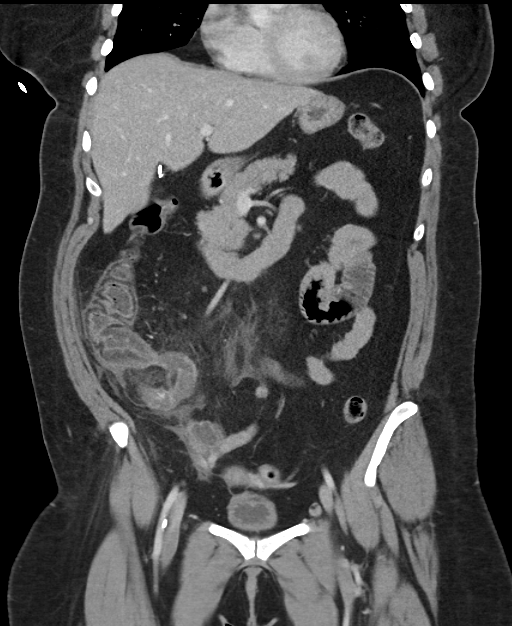
[im 57/103  soft-tissue]
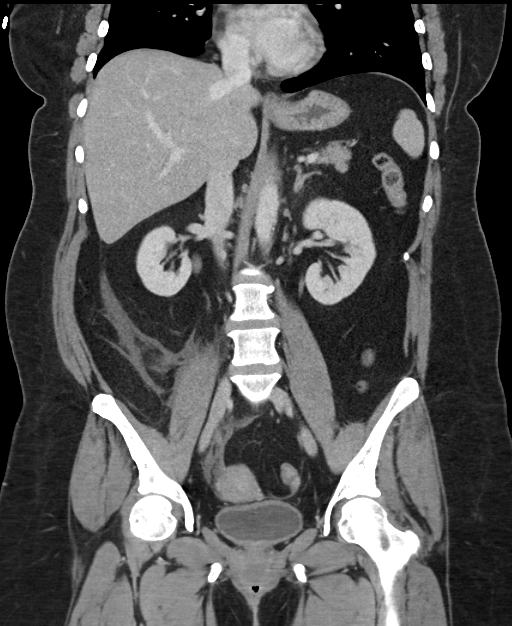

[15 of 46 positions shown; findings below may reference images not displayed]

FINDINGS: Lower chest: Limited visualization of the lower thorax demonstrates
minimal dependent subpleural ground-glass atelectasis. No focal
airspace opacities. No pleural effusion.

Normal heart size. No pericardial effusion. Note is made of
prominent bilateral epicardial fat pads with a prominent though
nonenlarged pericardial lymph node which measures approximately
cm in greatest short axis diameter (image 11, series 2), presumably
reactive in etiology.

Hepatobiliary: Normal hepatic contour. Minimal amount of focal fatty
infiltration adjacent to the fissure for ligamentum teres. No
discrete hepatic lesions. Post cholecystectomy. No intra or
extrahepatic biliary ductal dilatation. No ascites.

Pancreas: Normal appearance of the pancreas. No peripancreatic
stranding.

Spleen: Normal appearance of the spleen.

Adrenals/Urinary Tract: There is symmetric enhancement and excretion
of the bilateral kidneys. No definite renal stones on this
postcontrast examination. Punctate sub cm hypo attenuating
left-sided renal lesions are too small to adequately characterize
though favored to represent renal cysts. No urinary obstruction or
perinephric stranding.

Normal appearance the bilateral adrenal glands.

Normal appearance of the urinary bladder given underdistention.

Stomach/Bowel: The appendix is markedly thickened measuring
approximately 1.7 cm in diameter (coronal image 46, series 5) with
associated moderate amount of periappendiceal stranding, findings
worrisome for acute appendicitis. No evidence of perforation or
definable / drainable fluid collection.

Scattered colonic diverticulosis without evidence of diverticulitis.
The bowel is otherwise normal in course and caliber without wall
thickening or evidence of enteric obstruction. No pneumoperitoneum,
pneumatosis or portal venous gas.

Vascular/Lymphatic: Scattered minimal amount of mixed calcified and
noncalcified atherosclerotic plaque within a normal caliber
abdominal aorta. The major branch vessels of the abdominal aorta
appear patent on this non CTA examination.

Scattered retroperitoneal lymph nodes are numerous though
individually not enlarged by size criteria with index aortocaval
lymph node measuring 0.7 cm in greatest short axis diameter. No
bulky retroperitoneal, mesenteric, pelvic or inguinal
lymphadenopathy.

Reproductive: Normal appearance of the pelvic organs. No free fluid
in the pelvic cul-de-sac.

Other: Small mesenteric fat containing periumbilical hernia.
Regional soft tissues appear otherwise normal.

Musculoskeletal: No acute or aggressive osseous abnormalities.
Bilateral L5 pars defects with associated grade 1 anterolisthesis of
L5 upon S1 measuring 7 mm (sagittal image 68, series 6) and moderate
to severe DDD of L5 and S1.
IMPRESSION: 1. Findings compatible with acute uncomplicated appendicitis. No
evidence of perforation or definable / drainable fluid collection.
2. Colonic diverticulosis without evidence of diverticulitis.
3. Bilateral L5 pars defects with associated grade 1 anterolisthesis
and moderate to severe DDD of L5-S1.

## 2018-09-08 ENCOUNTER — Other Ambulatory Visit: Payer: Self-pay | Admitting: Family Medicine

## 2018-09-08 DIAGNOSIS — N951 Menopausal and female climacteric states: Secondary | ICD-10-CM

## 2018-09-24 ENCOUNTER — Other Ambulatory Visit: Payer: Self-pay | Admitting: Family Medicine

## 2018-09-24 DIAGNOSIS — F4322 Adjustment disorder with anxiety: Secondary | ICD-10-CM

## 2018-10-06 ENCOUNTER — Other Ambulatory Visit: Payer: Self-pay | Admitting: *Deleted

## 2018-10-06 DIAGNOSIS — R062 Wheezing: Secondary | ICD-10-CM

## 2018-10-06 DIAGNOSIS — F4322 Adjustment disorder with anxiety: Secondary | ICD-10-CM

## 2018-10-06 MED ORDER — FLUOXETINE HCL 40 MG PO CAPS: 40.0000 mg | ORAL_CAPSULE | Freq: Every day | ORAL | 0 refills | Status: DC

## 2018-10-06 MED ORDER — ALBUTEROL SULFATE HFA 108 (90 BASE) MCG/ACT IN AERS: INHALATION_SPRAY | RESPIRATORY_TRACT | 11 refills | Status: DC

## 2018-10-12 ENCOUNTER — Other Ambulatory Visit: Payer: Self-pay

## 2018-10-12 ENCOUNTER — Ambulatory Visit (INDEPENDENT_AMBULATORY_CARE_PROVIDER_SITE_OTHER): Payer: BC Managed Care – PPO | Admitting: Family Medicine

## 2018-10-12 DIAGNOSIS — N951 Menopausal and female climacteric states: Secondary | ICD-10-CM | POA: Diagnosis not present

## 2018-10-12 DIAGNOSIS — F4322 Adjustment disorder with anxiety: Secondary | ICD-10-CM

## 2018-10-12 MED ORDER — BUSPIRONE HCL 5 MG PO TABS: ORAL_TABLET | ORAL | 1 refills | Status: DC

## 2018-10-12 MED ORDER — GABAPENTIN 300 MG PO CAPS: 300.0000 mg | ORAL_CAPSULE | Freq: Every day | ORAL | 1 refills | Status: DC

## 2018-10-12 MED ORDER — FLUOXETINE HCL 40 MG PO CAPS: 40.0000 mg | ORAL_CAPSULE | Freq: Every day | ORAL | 3 refills | Status: DC

## 2018-10-12 NOTE — Progress Notes (Signed)
Telephone visit  Subjective: CC: f/u anxiety/ depression PCP: Tracy Norlander, Tracy [name] FKC:LEXNT Tracy [name] is a 48 y.o. female calls for telephone consult today. Patient provides verbal consent for consult held via phone.  Location of patient: home Location of provider: [facility] Others present for call:  daughter  1. Depression/ Anxiety Patient was last seen in December of last year.  At that time, she had been having increasing symptoms of depression and anhedonia.  Symptoms seem to be exacerbated after the passing of her mother and father the year previous.  She notes the anniversary of her mother's death this past Father's Day which did exacerbate her symptoms.  She does feel like she needs medication adjustment.  Of note she has been out of her Prozac for about 2 weeks now.  She was unaware that there was a prescription sent in a week ago.  Denies any SI or HI.  She does report feeling more depressed and subsequently has been sleeping a bit more than she normally does.  2.  Hot flashes Patient is status post menopause almost 3 years now.  She is started on gabapentin 100 mg for vasomotor symptoms related to menopausal state in December.  She notes that the 100 mg of gabapentin does help some but she occasionally has breakthrough symptoms and would like this dose increased as well.   ROS: Per HPI  Allergies  Allergen Reactions  . Codeine Nausea And Vomiting  . Bactrim [Sulfamethoxazole-Trimethoprim] Hives  . Wellbutrin [Bupropion]     extreme irritability   Past Medical History:  Diagnosis Date  . GERD (gastroesophageal reflux disease)   . Hyperlipidemia   . PONV (postoperative nausea and vomiting)    had to use a scop patch 2nd c-sec  . Wears dentures    top    Current Outpatient Medications:  .  albuterol (PROVENTIL HFA) 108 (90 Base) MCG/ACT inhaler, INHALE 2 PUFFS EVERY 6 (SIX) HOURS AS NEEDED INTO THE LUNGS FOR WHEEZING OR SHORTNESS OF BREATH., Disp: 6.7 g, Rfl: 11 .   FLUoxetine (PROZAC) 40 MG capsule, Take 1 capsule (40 mg total) by mouth daily. Please make your followup med appt, Disp: 30 capsule, Rfl: 0 .  gabapentin (NEURONTIN) 100 MG capsule, TAKE 1 CAPSULE (100 MG TOTAL) BY MOUTH AT BEDTIME., Disp: 90 capsule, Rfl: 0 .  ibuprofen (ADVIL,MOTRIN) 200 MG tablet, Take 200 mg by mouth every 6 (six) hours as needed for moderate pain., Disp: , Rfl:  .  ketoconazole (NIZORAL) 2 % shampoo, Apply 1 application topically 2 (two) times a week. (May leave on for 10 minutes then rinse off) x2 weeks, Disp: 120 mL, Rfl: 0 .  omeprazole (PRILOSEC) 20 MG capsule, Take 1 capsule (20 mg total) by mouth daily., Disp: 90 capsule, Rfl: 1  Depression screen [facility] 2/9 10/12/2018 04/01/2018 09/01/2017  Decreased Interest 2 2 3   Down, Depressed, Hopeless 0 2 1  PHQ - 2 Score 2 4 4   Altered sleeping 3 2 3   Tired, decreased energy 3 2 2   Change in appetite 2 0 0  Feeling bad or failure about yourself  2 1 1   Trouble concentrating 1 0 1  Moving slowly or fidgety/restless 1 0 0  Suicidal thoughts 0 0 0  PHQ-9 Score 14 9 11   Difficult doing work/chores Somewhat difficult Somewhat difficult Somewhat difficult   GAD 7 : Generalized Anxiety Score 10/12/2018 04/01/2018  Nervous, Anxious, on Edge 2 1  Control/stop worrying 2 1  Worry too much - different  things 3 2  Trouble relaxing 2 1  Restless 0 1  Easily annoyed or irritable 3 2  Afraid - awful might happen 1 1  Total GAD 7 Score 13 9  Anxiety Difficulty Somewhat difficult Somewhat difficult   Assessment/ Plan: 48 y.o. female   1. Adjustment disorder with anxious mood Not controlled.  She has had some increased symptoms since being off her medicine for the last 2 weeks as well as the recent anniversary of her mother's death.  Continue Prozac 40 mg daily working to add buspirone to help with anxiety and depressive symptoms.  We discussed the titration schedule.  I would like to see her back in about 4 to 6 weeks for recheck.  She  will contact me in the interim if needed. - FLUoxetine (PROZAC) 40 MG capsule; Take 1 capsule (40 mg total) by mouth daily.  Dispense: 90 capsule; Refill: 3 - busPIRone (BUSPAR) 5 MG tablet; Take 1 tablet BID x1 week, then increase to 1.5 tablets BID x1 week, then 2 tablets BID  Dispense: 120 tablet; Refill: 1  2. Hot flashes due to menopause Continues to have some breakthrough symptoms.  Increase gabapentin to 300 mg nightly.  She still has some of the 100 mg capsules she will take 200 mg for 1 week and then increase to 300 mg at bedtime. - gabapentin (NEURONTIN) 300 MG capsule; Take 1 capsule (300 mg total) by mouth at bedtime.  Dispense: 90 capsule; Refill: 1   Start time: 1:12pm End time: 1:26pm  Total time spent on patient care (including telephone call/ virtual visit): 16 minutes   Hulen SkainsM , Tracy [name] [facility] [phone]

## 2018-10-19 ENCOUNTER — Other Ambulatory Visit: Payer: Self-pay | Admitting: Family Medicine

## 2018-10-19 DIAGNOSIS — K219 Gastro-esophageal reflux disease without esophagitis: Secondary | ICD-10-CM

## 2018-11-03 ENCOUNTER — Other Ambulatory Visit: Payer: Self-pay | Admitting: Family Medicine

## 2018-11-03 DIAGNOSIS — F4322 Adjustment disorder with anxiety: Secondary | ICD-10-CM

## 2018-11-03 NOTE — Telephone Encounter (Signed)
Pharmacy comment:  REQUEST FOR 90 DAYS PRESCRIPTION. DX Code Needed    

## 2018-11-03 NOTE — Telephone Encounter (Signed)
This medication is being titrated up.  90 days is not appropriate.

## 2018-12-04 ENCOUNTER — Other Ambulatory Visit: Payer: Self-pay | Admitting: Family Medicine

## 2018-12-04 DIAGNOSIS — N951 Menopausal and female climacteric states: Secondary | ICD-10-CM

## 2018-12-06 ENCOUNTER — Other Ambulatory Visit: Payer: Self-pay | Admitting: Family Medicine

## 2018-12-06 DIAGNOSIS — F4322 Adjustment disorder with anxiety: Secondary | ICD-10-CM

## 2018-12-06 NOTE — Telephone Encounter (Signed)
Pharmacy comment:  REQUEST FOR 90 DAYS PRESCRIPTION. DX Code Needed    

## 2018-12-06 NOTE — Telephone Encounter (Signed)
Left message to please call our office to schedule an office visit or telephone visit to get further refills.

## 2018-12-06 NOTE — Telephone Encounter (Signed)
Please schedule a f/u visit for patient.  Virtual ok.  Need to determine what dose she ended up on before refilling.

## 2018-12-07 ENCOUNTER — Other Ambulatory Visit: Payer: Self-pay | Admitting: Family Medicine

## 2018-12-07 DIAGNOSIS — F4322 Adjustment disorder with anxiety: Secondary | ICD-10-CM

## 2018-12-21 ENCOUNTER — Ambulatory Visit (INDEPENDENT_AMBULATORY_CARE_PROVIDER_SITE_OTHER): Payer: BC Managed Care – PPO | Admitting: Family Medicine

## 2018-12-21 DIAGNOSIS — F4322 Adjustment disorder with anxiety: Secondary | ICD-10-CM

## 2018-12-21 DIAGNOSIS — N951 Menopausal and female climacteric states: Secondary | ICD-10-CM

## 2018-12-21 DIAGNOSIS — K219 Gastro-esophageal reflux disease without esophagitis: Secondary | ICD-10-CM | POA: Diagnosis not present

## 2018-12-21 MED ORDER — BUSPIRONE HCL 10 MG PO TABS: ORAL_TABLET | ORAL | 0 refills | Status: DC

## 2018-12-21 MED ORDER — OMEPRAZOLE 20 MG PO CPDR: 20.0000 mg | DELAYED_RELEASE_CAPSULE | Freq: Every day | ORAL | 3 refills | Status: DC

## 2018-12-21 NOTE — Progress Notes (Signed)
Telephone visit  Subjective: CC: f/u GAD PCP: Raliegh IpGottschalk, Shaynah Hund M, DO WUJ:WJXBJHPI:Tracy [name] is a 48 y.o. female calls for telephone consult today. Patient provides verbal consent for consult held via phone.  Location of patient: home Location of provider: Working remotely from home Others present for call:  none  1.  Generalized anxiety disorder Patient reports that she has been feeling quite a bit better with the buspirone.  She notes that she is less moody.  She continues to have some anxiety.  Denies any side effects of the buspirone.  She is currently taking 10 mg twice daily.  She is also compliant with Prozac 40 mg daily.  2.  Hot flashes Patient reports her hot flashes have improved quite a bit with the gabapentin.  She does note she still occasionally has some breakthrough hot flashes but overall they have reduced.  Denies any excessive sedation with the gabapentin.  She is tolerating the medication without difficulty  3.  GERD Patient reports good control of acid reflux with omeprazole.  No nausea, vomiting, abdominal pain.  She does need refills.   ROS: Per HPI  Allergies  Allergen Reactions  . Codeine Nausea And Vomiting  . Bactrim [Sulfamethoxazole-Trimethoprim] Hives  . Wellbutrin [Bupropion]     extreme irritability   Past Medical History:  Diagnosis Date  . GERD (gastroesophageal reflux disease)   . Hyperlipidemia   . PONV (postoperative nausea and vomiting)    had to use a scop patch 2nd c-sec  . Wears dentures    top    Current Outpatient Medications:  .  albuterol (PROVENTIL HFA) 108 (90 Base) MCG/ACT inhaler, INHALE 2 PUFFS EVERY 6 (SIX) HOURS AS NEEDED INTO THE LUNGS FOR WHEEZING OR SHORTNESS OF BREATH., Disp: 6.7 g, Rfl: 11 .  busPIRone (BUSPAR) 5 MG tablet, Take 1 tablet BID x1 week, then increase to 1.5 tablets BID x1 week, then 2 tablets BID, Disp: 120 tablet, Rfl: 1 .  FLUoxetine (PROZAC) 40 MG capsule, Take 1 capsule (40 mg total) by mouth daily.,  Disp: 90 capsule, Rfl: 3 .  gabapentin (NEURONTIN) 300 MG capsule, Take 1 capsule (300 mg total) by mouth at bedtime., Disp: 90 capsule, Rfl: 1 .  ibuprofen (ADVIL,MOTRIN) 200 MG tablet, Take 200 mg by mouth every 6 (six) hours as needed for moderate pain., Disp: , Rfl:  .  ketoconazole (NIZORAL) 2 % shampoo, Apply 1 application topically 2 (two) times a week. (May leave on for 10 minutes then rinse off) x2 weeks, Disp: 120 mL, Rfl: 0 .  omeprazole (PRILOSEC) 20 MG capsule, TAKE 1 CAPSULE BY MOUTH EVERY DAY, Disp: 90 capsule, Rfl: 0  Dep[facility] screen [facility] 2/9 12/21/2018 10/12/2018 04/01/2018 09/01/2017 02/17/2017  Decreased Interest 1 2 2 3 2   Down, Depressed, Hopeless 0 0 2 1 0  PHQ - 2 Score 1 2 4 4 2   Altered sleeping 3 3 2 3 3   Tired, decreased energy 1 3 2 2 2   Change in appetite 1 2 0 0 0  Feeling bad or failure about yourself  1 2 1 1  0  Trouble concentrating 1 1 0 1 0  Moving slowly or fidgety/restless 0 1 0 0 0  Suicidal thoughts 0 0 0 0 0  PHQ-9 Score 8 14 9 11 7   Difficult doing work/chores Somewhat difficult Somewhat difficult Somewhat difficult Somewhat difficult Somewhat difficult   GAD 7 : Generalized Anxiety Score 12/21/2018 10/12/2018 04/01/2018  Nervous, Anxious, on Edge 1 2 1   Control/stop  worrying 0 2 1  Worry too much - different things 1 3 2   Trouble relaxing 1 2 1   Restless 1 0 1  Easily annoyed or irritable 0 3 2  Afraid - awful might happen 0 1 1  Total GAD 7 Score 4 13 9   Anxiety Difficulty Somewhat difficult Somewhat difficult Somewhat difficult    Assessment/ Plan: 48 y.o. female   1. Adjustment disorder with anxious mood Much improved from previous but patient would like to push the dose to see if she can get better control/resolution of anxiety symptoms.  I have given her instructions on how to increase the buspirone and informed her of new dosage pill.  She will follow-up in the next 4 to 6 weeks, sooner if needed - busPIRone (BUSPAR) 10 MG tablet; Take 1.5  tab (15mg ) every morning and 1 tab (10mg ) every evening x7d. Then increase to 1.5 tab BID x7d. Then increase to 2 tab (20mg ) QAM and 1.5 tab qpm x7d. Then 2 tab BID.  Dispense: 120 tablet; Refill: 0  2. Hot flashes due to menopause Moderately controlled with gabapentin.  Continue current dose  3. Gastroesophageal reflux disease without esophagitis Controlled.  Renewal of omeprazole sent - omeprazole (PRILOSEC) 20 MG capsule; Take 1 capsule (20 mg total) by mouth daily.  Dispense: 90 capsule; Refill: 3   Start time: 8:34am End time: 8:44am  Total time spent on patient care (including telephone call/ virtual visit): 16 minutes  [place], [place] [phone]

## 2019-01-17 ENCOUNTER — Other Ambulatory Visit: Payer: Self-pay | Admitting: Family Medicine

## 2019-01-17 DIAGNOSIS — F4322 Adjustment disorder with anxiety: Secondary | ICD-10-CM

## 2019-01-17 NOTE — Telephone Encounter (Signed)
Patient needs f/u on Buspar.  Please schedule for refills so I can determine what dose is working for her.  Televisit ok.

## 2019-01-17 NOTE — Telephone Encounter (Signed)
lmtcb

## 2019-01-24 ENCOUNTER — Ambulatory Visit (INDEPENDENT_AMBULATORY_CARE_PROVIDER_SITE_OTHER): Payer: BC Managed Care – PPO | Admitting: Family Medicine

## 2019-01-24 DIAGNOSIS — N951 Menopausal and female climacteric states: Secondary | ICD-10-CM

## 2019-01-24 DIAGNOSIS — F4322 Adjustment disorder with anxiety: Secondary | ICD-10-CM | POA: Diagnosis not present

## 2019-01-24 MED ORDER — BUSPIRONE HCL 10 MG PO TABS: 20.0000 mg | ORAL_TABLET | Freq: Two times a day (BID) | ORAL | 1 refills | Status: DC

## 2019-01-24 NOTE — Progress Notes (Signed)
Telephone visit  Subjective: CC: f/u mood PCP: Janora Norlander, DO JWJ:XBJYN J Reaver is a 48 y.o. female calls for telephone consult today. Patient provides verbal consent for consult held via phone.  Location of patient: home Location of provider: Working remotely from home Others present for call: none  1.  Anxiety and depression Patient reports good control of symptoms with the increased dose of BuSpar.  She is taking 20 mg twice daily.  She feels that the twice daily dosing is covering her throughout the day well.  She still not 100% back to her baseline but she is pleased with how far she is calm with the fluoxetine and buspirone and does not wish to adjust doses further.  She at baseline is somewhat restless but is able to sit and relax.  2.  Hot flashes Patient reports compliance with gabapentin 300 mg nightly.  She reports that she takes this earlier in the evening so she does not have drowsiness during the daytime.  She does continue to have 2 or 3 hot flashes during the day but this is down from 10+ prior to start of medicine.   ROS: Per HPI  Allergies  Allergen Reactions  . Codeine Nausea And Vomiting  . Bactrim [Sulfamethoxazole-Trimethoprim] Hives  . Wellbutrin [Bupropion]     extreme irritability   Past Medical History:  Diagnosis Date  . GERD (gastroesophageal reflux disease)   . Hyperlipidemia   . PONV (postoperative nausea and vomiting)    had to use a scop patch 2nd c-sec  . Wears dentures    top    Current Outpatient Medications:  .  albuterol (PROVENTIL HFA) 108 (90 Base) MCG/ACT inhaler, INHALE 2 PUFFS EVERY 6 (SIX) HOURS AS NEEDED INTO THE LUNGS FOR WHEEZING OR SHORTNESS OF BREATH., Disp: 6.7 g, Rfl: 11 .  busPIRone (BUSPAR) 10 MG tablet, Take 1.5 tab (15mg ) every morning and 1 tab (10mg ) every evening x7d. Then increase to 1.5 tab BID x7d. Then increase to 2 tab (20mg ) QAM and 1.5 tab qpm x7d. Then 2 tab BID., Disp: 120 tablet, Rfl: 0 .   FLUoxetine (PROZAC) 40 MG capsule, Take 1 capsule (40 mg total) by mouth daily., Disp: 90 capsule, Rfl: 3 .  gabapentin (NEURONTIN) 300 MG capsule, Take 1 capsule (300 mg total) by mouth at bedtime., Disp: 90 capsule, Rfl: 1 .  ibuprofen (ADVIL,MOTRIN) 200 MG tablet, Take 200 mg by mouth every 6 (six) hours as needed for moderate pain., Disp: , Rfl:  .  ketoconazole (NIZORAL) 2 % shampoo, Apply 1 application topically 2 (two) times a week. (May leave on for 10 minutes then rinse off) x2 weeks, Disp: 120 mL, Rfl: 0 .  omeprazole (PRILOSEC) 20 MG capsule, Take 1 capsule (20 mg total) by mouth daily., Disp: 90 capsule, Rfl: 3  Depression screen [facility] 2/9 01/24/2019 12/21/2018 10/12/2018  Decreased Interest 1 1 2   Down, Depressed, Hopeless 0 0 0  PHQ - 2 Score 1 1 2   Altered sleeping - 3 3  Tired, decreased energy - 1 3  Change in appetite - 1 2  Feeling bad or failure about yourself  - 1 2  Trouble concentrating - 1 1  Moving slowly or fidgety/restless - 0 1  Suicidal thoughts - 0 0  PHQ-9 Score - 8 14  Difficult doing work/chores - Somewhat difficult Somewhat difficult   GAD 7 : Generalized Anxiety Score 01/24/2019 12/21/2018 10/12/2018 04/01/2018  Nervous, Anxious, on Edge 1 1 2 1   Control/stop  worrying 0 0 2 1  Worry too much - different things 0 1 3 2   Trouble relaxing 1 1 2 1   Restless 1 1 0 1  Easily annoyed or irritable 0 0 3 2  Afraid - awful might happen 0 0 1 1  Total GAD 7 Score 3 4 13 9   Anxiety Difficulty Somewhat difficult Somewhat difficult Somewhat difficult Somewhat difficult    Assessment/ Plan: 48 y.o. female   1. Adjustment disorder with anxious mood Doing better with the increased dose of buspirone.  Continue fluoxetine 40 mg daily as well.  She will follow-up with me in 6 months, sooner if needed. - busPIRone (BUSPAR) 10 MG tablet; Take 2 tablets (20 mg total) by mouth 2 (two) times daily.  Dispense: 360 tablet; Refill: 1  2. Hot flashes due to menopause Stable with  only 2-3 hot flashes per day, this is down from 10 previously.  Continue gabapentin at nighttime only given drowsiness associated with the medication.   Start time: 8:26am End time: 8:34am  Total time spent on patient care (including telephone call/ virtual visit): 15 minutes  Benji Poynter , DO [facility] [phone]

## 2019-06-14 ENCOUNTER — Other Ambulatory Visit: Payer: Self-pay | Admitting: Family Medicine

## 2019-06-14 DIAGNOSIS — N951 Menopausal and female climacteric states: Secondary | ICD-10-CM

## 2019-07-20 ENCOUNTER — Other Ambulatory Visit: Payer: Self-pay | Admitting: Family Medicine

## 2019-07-20 DIAGNOSIS — F4322 Adjustment disorder with anxiety: Secondary | ICD-10-CM

## 2019-08-17 ENCOUNTER — Other Ambulatory Visit: Payer: Self-pay | Admitting: Family Medicine

## 2019-08-17 DIAGNOSIS — F4322 Adjustment disorder with anxiety: Secondary | ICD-10-CM

## 2019-08-18 ENCOUNTER — Other Ambulatory Visit: Payer: Self-pay | Admitting: Family Medicine

## 2019-08-18 DIAGNOSIS — F4322 Adjustment disorder with anxiety: Secondary | ICD-10-CM

## 2019-08-18 NOTE — Telephone Encounter (Signed)
Needs to be seen

## 2019-10-04 ENCOUNTER — Other Ambulatory Visit: Payer: Self-pay | Admitting: Family Medicine

## 2019-10-04 DIAGNOSIS — F4322 Adjustment disorder with anxiety: Secondary | ICD-10-CM

## 2019-10-19 ENCOUNTER — Other Ambulatory Visit: Payer: Self-pay | Admitting: Family Medicine

## 2019-10-19 DIAGNOSIS — F4322 Adjustment disorder with anxiety: Secondary | ICD-10-CM

## 2019-10-20 ENCOUNTER — Telehealth: Payer: Self-pay | Admitting: Family Medicine

## 2019-10-20 ENCOUNTER — Other Ambulatory Visit: Payer: Self-pay | Admitting: *Deleted

## 2019-10-20 DIAGNOSIS — F4322 Adjustment disorder with anxiety: Secondary | ICD-10-CM

## 2019-10-20 MED ORDER — BUSPIRONE HCL 10 MG PO TABS: 20.0000 mg | ORAL_TABLET | Freq: Two times a day (BID) | ORAL | 0 refills | Status: DC

## 2019-10-20 NOTE — Telephone Encounter (Signed)
Left detailed message for patient to call back to schedule appointment for medication refills.

## 2019-10-20 NOTE — Telephone Encounter (Signed)
Last OV 01/2019. Last RF 10/04/19. Next OV not scheduled  Refill denied, ntbs for further refills

## 2019-10-20 NOTE — Telephone Encounter (Signed)
Yes. 1rf until she is seen

## 2019-10-20 NOTE — Telephone Encounter (Signed)
Refill

## 2019-10-20 NOTE — Telephone Encounter (Signed)
  Prescription Request  10/20/2019  What is the name of the medication or equipment?busPIRone (BUSPAR) 10 MG tablet   Have you contacted your pharmacy to request a refill? (if applicable) YES  Which pharmacy would you like this sent to? [facility], Pt is out has appt with PCP next month    Patient notified that their request is being sent to the clinical staff for review and that they should receive a response within 2 business days.

## 2019-10-20 NOTE — Telephone Encounter (Signed)
Patient aware, per message left on her voice mail,  script is ready.

## 2019-10-27 ENCOUNTER — Other Ambulatory Visit: Payer: Self-pay | Admitting: Family Medicine

## 2019-10-27 DIAGNOSIS — F4322 Adjustment disorder with anxiety: Secondary | ICD-10-CM

## 2019-11-10 ENCOUNTER — Other Ambulatory Visit: Payer: Self-pay | Admitting: Family Medicine

## 2019-11-10 DIAGNOSIS — F4322 Adjustment disorder with anxiety: Secondary | ICD-10-CM

## 2019-11-10 DIAGNOSIS — N951 Menopausal and female climacteric states: Secondary | ICD-10-CM

## 2019-11-10 DIAGNOSIS — K219 Gastro-esophageal reflux disease without esophagitis: Secondary | ICD-10-CM

## 2019-11-18 ENCOUNTER — Other Ambulatory Visit: Payer: Self-pay | Admitting: Family Medicine

## 2019-11-18 DIAGNOSIS — F4322 Adjustment disorder with anxiety: Secondary | ICD-10-CM

## 2019-11-20 NOTE — Telephone Encounter (Signed)
This was given as a courtesy 30 d script.  She is overdue for appt.

## 2019-11-24 ENCOUNTER — Encounter: Payer: Self-pay | Admitting: Gastroenterology

## 2019-11-24 ENCOUNTER — Other Ambulatory Visit: Payer: Self-pay

## 2019-11-24 ENCOUNTER — Ambulatory Visit (INDEPENDENT_AMBULATORY_CARE_PROVIDER_SITE_OTHER): Payer: BC Managed Care – PPO | Admitting: Family Medicine

## 2019-11-24 ENCOUNTER — Encounter: Payer: Self-pay | Admitting: Family Medicine

## 2019-11-24 VITALS — BP 129/79 | HR 78 | Temp 97.4°F | Ht 59.5 in | Wt 196.0 lb

## 2019-11-24 DIAGNOSIS — N951 Menopausal and female climacteric states: Secondary | ICD-10-CM | POA: Diagnosis not present

## 2019-11-24 DIAGNOSIS — K219 Gastro-esophageal reflux disease without esophagitis: Secondary | ICD-10-CM

## 2019-11-24 DIAGNOSIS — F4322 Adjustment disorder with anxiety: Secondary | ICD-10-CM | POA: Diagnosis not present

## 2019-11-24 DIAGNOSIS — Z1159 Encounter for screening for other viral diseases: Secondary | ICD-10-CM

## 2019-11-24 DIAGNOSIS — Z720 Tobacco use: Secondary | ICD-10-CM

## 2019-11-24 DIAGNOSIS — R198 Other specified symptoms and signs involving the digestive system and abdomen: Secondary | ICD-10-CM

## 2019-11-24 LAB — BAYER DCA HB A1C WAIVED: HB A1C (BAYER DCA - WAIVED): 5.8 % (ref <7.0)

## 2019-11-24 MED ORDER — GABAPENTIN 300 MG PO CAPS: ORAL_CAPSULE | ORAL | 3 refills | Status: DC

## 2019-11-24 MED ORDER — BUSPIRONE HCL 30 MG PO TABS: 30.0000 mg | ORAL_TABLET | Freq: Two times a day (BID) | ORAL | 3 refills | Status: DC

## 2019-11-24 MED ORDER — OMEPRAZOLE 20 MG PO CPDR: DELAYED_RELEASE_CAPSULE | ORAL | 3 refills | Status: DC

## 2019-11-24 MED ORDER — FLUOXETINE HCL 40 MG PO CAPS: 40.0000 mg | ORAL_CAPSULE | Freq: Every day | ORAL | 3 refills | Status: DC

## 2019-11-24 NOTE — Patient Instructions (Signed)

## 2019-11-24 NOTE — Progress Notes (Signed)
Subjective: CC: Depression, hot flashes, GI probs PCP: Janora Norlander, DO CWC:BJSEG Tracy [name] is a 49 y.o. female presenting to clinic today for:  1. Depression History: long standing history.  Exacerbated after passing of her mother and father last year.  Previously treated with Celexa (stopped due to side effects), Zoloft (flat affect on). No history of hospitalization for mental health disorder.  Compliant with Prozac 40 mg daily, BuSpar 20 mg twice daily.  She has had some increased stress and moodiness this year.  She unfortunately lost a niece to overdose in March.  This has been the third consecutive year that she is lost a family member.  She also is dealing with her 73 year old daughter, which has caused some stress.  No SI, HI.  2.  Hot flashes Stable on gabapentin.  Continues to have some breakthrough hot flashes.  3.  Irritable bowel Patient reports alternating constipation and diarrhea.  She would like to get checked out by gastroenterologist potentially have a colonoscopy.  No hematochezia, melena, nausea, vomiting.  She is tolerating p.o. intake without difficulty.  She has history of cholecystectomy and appendectomy.  ROS: Per HPI  Allergies  Allergen Reactions  . Codeine Nausea And Vomiting  . Bactrim [Sulfamethoxazole-Trimethoprim] Hives  . Wellbutrin [Bupropion]     extreme irritability   Past Medical History:  Diagnosis Date  . Anxiety   . Depression   . GERD (gastroesophageal reflux disease)   . Hyperlipidemia   . PONV (postoperative nausea and vomiting)    had to use a scop patch 2nd c-sec  . Wears dentures    top    Current Outpatient Medications:  .  busPIRone (BUSPAR) 10 MG tablet, Take 2 tablets (20 mg total) by mouth 2 (two) times daily., Disp: 60 tablet, Rfl: 0 .  FLUoxetine (PROZAC) 40 MG capsule, TAKE 1 CAPSULE (40 MG TOTAL) BY MOUTH DAILY. (NEEDS TO BE SEEN BEFORE NEXT REFILL), Disp: 30 capsule, Rfl: 1 .  gabapentin (NEURONTIN) 300 MG  capsule, TAKE 1 CAPSULE BY MOUTH EVERYDAY AT BEDTIME, Disp: 90 capsule, Rfl: 0 .  ibuprofen (ADVIL,MOTRIN) 200 MG tablet, Take 200 mg by mouth every 6 (six) hours as needed for moderate pain., Disp: , Rfl:  .  albuterol (PROVENTIL HFA) 108 (90 Base) MCG/ACT inhaler, INHALE 2 PUFFS EVERY 6 (SIX) HOURS AS NEEDED INTO THE LUNGS FOR WHEEZING OR SHORTNESS OF BREATH. (Patient not taking: Reported on 11/24/2019), Disp: 6.7 g, Rfl: 11 .  ketoconazole (NIZORAL) 2 % shampoo, Apply 1 application topically 2 (two) times a week. (May leave on for 10 minutes then rinse off) x2 weeks (Patient not taking: Reported on 11/24/2019), Disp: 120 mL, Rfl: 0 .  omeprazole (PRILOSEC) 20 MG capsule, TAKE 1 CAPSULE BY MOUTH EVERY DAY, Disp: 90 capsule, Rfl: 0 Social History   Socioeconomic History  . Marital status: Single    Spouse name: Not on file  . Number of children: Not on file  . Years of education: Not on file  . Highest education level: Not on file  Occupational History  . Not on file  Tobacco Use  . Smoking status: Current Every Day Smoker    Packs/day: 1.00  . Smokeless tobacco: Never Used  Vaping Use  . Vaping Use: Never used  Substance and Sexual Activity  . Alcohol use: No    Comment: rare  . Drug use: No  . Sexual activity: Yes    Birth control/protection: None  Other Topics Concern  . Not on  file  Social History Narrative  . Not on file   Social Determinants of Health   Financial Resource Strain:   . Difficulty of Paying Living Expenses:   Food Insecurity:   . Worried About Charity fundraiser in the Last Year:   . Arboriculturist in the Last Year:   Transportation Needs:   . Film/video editor (Medical):   Marland Kitchen Lack of Transportation (Non-Medical):   Physical Activity:   . Days of Exercise per Week:   . Minutes of Exercise per Session:   Stress:   . Feeling of Stress :   Social Connections:   . Frequency of Communication with Friends and Family:   . Frequency of Social  Gatherings with Friends and Family:   . Attends Religious Services:   . Active Member of Clubs or Organizations:   . Attends Archivist Meetings:   Marland Kitchen Marital Status:   Intimate Partner Violence:   . Fear of Current or Ex-Partner:   . Emotionally Abused:   Marland Kitchen Physically Abused:   . Sexually Abused:    Family History  Problem Relation Age of Onset  . COPD Mother   . Heart disease Mother   . Heart disease Father   . Diabetes Father   . Heart disease Brother   . Breast cancer Other        aunt  . Colon cancer Other        great uncle  . Lung cancer Other        uncle  . Esophageal cancer Other        grandfather    Objective: Office vital signs reviewed. BP 129/79   Pulse 78   Temp (!) 97.4 F (36.3 C) (Temporal)   Ht 4' 11.5" (1.511 m)   Wt 196 lb (88.9 kg)   LMP 08/31/2016   SpO2 94%   BMI 38.92 kg/m   Physical Examination:  General: Awake, alert, well nourished, No acute distress HEENT: Normal, sclera white, MMM Cardio: regular rate and rhythm, S1S2 heard, no murmurs appreciated Pulm: clear to auscultation bilaterally, no wheezes, rhonchi or rales; normal work of breathing on room air GI: Obese, soft, nontender, nondistended Psych: Mood stable, speech normal, affect appropriate, pleasant and interactive.   Depression screen [facility] 2/9 11/24/2019 01/24/2019 12/21/2018  Decreased Interest 1 1 1   Down, Depressed, Hopeless 1 0 0  PHQ - 2 Score 2 1 1   Altered sleeping 1 - 3  Tired, decreased energy 1 - 1  Change in appetite 0 - 1  Feeling bad or failure about yourself  1 - 1  Trouble concentrating 1 - 1  Moving slowly or fidgety/restless 0 - 0  Suicidal thoughts 0 - 0  PHQ-9 Score 6 - 8  Difficult doing work/chores Not difficult at all - Somewhat difficult   GAD 7 : Generalized Anxiety Score 11/24/2019 01/24/2019 12/21/2018 10/12/2018  Nervous, Anxious, on Edge 1 1 1 2   Control/stop worrying 1 0 0 2  Worry too much - different things 1 0 1 3  Trouble relaxing  1 1 1 2   Restless 1 1 1  0  Easily annoyed or irritable 1 0 0 3  Afraid - awful might happen 1 0 0 1  Total GAD 7 Score 7 3 4 13   Anxiety Difficulty Somewhat difficult Somewhat difficult Somewhat difficult Somewhat difficult    Assessment/ Plan: 49 y.o. female   1. Adjustment disorder with anxious mood Increase BuSpar to 30 mg  twice daily.  Continue fluoxetine.  Follow-up in 4 to 6 weeks, sooner if needed.  Discussed potential [facility] testing - CMP14+EGFR - FLUoxetine (PROZAC) 40 MG capsule; Take 1 capsule (40 mg total) by mouth daily.  Dispense: 90 capsule; Refill: 3  2. Hot flashes due to menopause Stable - CBC - gabapentin (NEURONTIN) 300 MG capsule; TAKE 1 CAPSULE BY MOUTH EVERYDAY AT BEDTIME  Dispense: 90 capsule; Refill: 3  3. Gastroesophageal reflux disease without esophagitis Stable - omeprazole (PRILOSEC) 20 MG capsule; TAKE 1 CAPSULE BY MOUTH EVERY DAY  Dispense: 90 capsule; Refill: 3  4. Tobacco use Contemplative - CBC  5. Morbid obesity ([place]) - CMP14+EGFR - Lipid Panel - TSH - Bayer DCA Hb A1c Waived  6. Encounter for hepatitis C screening test for low risk patient - Hepatitis C antibody  7. Alternating constipation and diarrhea Likely IBS.  Refer to gastroenterology for colon cancer screening - Ambulatory referral to Gastroenterology   Orders Placed This Encounter  Procedures  . CMP14+EGFR  . Lipid Panel  . TSH  . CBC  . Hepatitis C antibody  . Bayer DCA Hb A1c Waived   Meds ordered this encounter  Medications  . busPIRone (BUSPAR) 30 MG tablet    Sig: Take 1 tablet (30 mg total) by mouth 2 (two) times daily.    Dispense:  180 tablet    Refill:  3  . FLUoxetine (PROZAC) 40 MG capsule    Sig: Take 1 capsule (40 mg total) by mouth daily.    Dispense:  90 capsule    Refill:  3  . gabapentin (NEURONTIN) 300 MG capsule    Sig: TAKE 1 CAPSULE BY MOUTH EVERYDAY AT BEDTIME    Dispense:  90 capsule    Refill:  3  . omeprazole (PRILOSEC) 20 MG  capsule    Sig: TAKE 1 CAPSULE BY MOUTH EVERY DAY    Dispense:  90 capsule    Refill:  [place], [place] [phone]

## 2019-11-25 LAB — LIPID PANEL
Chol/HDL Ratio: 4.7 ratio — ABNORMAL HIGH (ref 0.0–4.4)
Cholesterol, Total: 235 mg/dL — ABNORMAL HIGH (ref 100–199)
HDL: 50 mg/dL (ref >39)
LDL Chol Calc (NIH): 160 mg/dL — ABNORMAL HIGH (ref 0–99)
Triglycerides: 136 mg/dL (ref 0–149)
VLDL Cholesterol Cal: 25 mg/dL (ref 5–40)

## 2019-11-25 LAB — CMP14+EGFR
ALT: 17 IU/L (ref 0–32)
AST: 16 IU/L (ref 0–40)
Albumin/Globulin Ratio: 1.5 (ref 1.2–2.2)
Albumin: 4.2 g/dL (ref 3.8–4.8)
Alkaline Phosphatase: 69 IU/L (ref 48–121)
BUN/Creatinine Ratio: 6 — ABNORMAL LOW (ref 9–23)
BUN: 6 mg/dL (ref 6–24)
Bilirubin Total: <0.2 mg/dL (ref 0.0–1.2)
CO2: 22 mmol/L (ref 20–29)
Calcium: 9.5 mg/dL (ref 8.7–10.2)
Chloride: 100 mmol/L (ref 96–106)
Creatinine, Ser: 0.96 mg/dL (ref 0.57–1.00)
GFR calc Af Amer: 81 mL/min/{1.73_m2} (ref >59)
GFR calc non Af Amer: 71 mL/min/{1.73_m2} (ref >59)
Globulin, Total: 2.8 g/dL (ref 1.5–4.5)
Glucose: 141 mg/dL — ABNORMAL HIGH (ref 65–99)
Potassium: 4.5 mmol/L (ref 3.5–5.2)
Sodium: 138 mmol/L (ref 134–144)
Total Protein: 7 g/dL (ref 6.0–8.5)

## 2019-11-25 LAB — CBC
Hematocrit: 39 % (ref 34.0–46.6)
Hemoglobin: 12.6 g/dL (ref 11.1–15.9)
MCH: 25.5 pg — ABNORMAL LOW (ref 26.6–33.0)
MCHC: 32.3 g/dL (ref 31.5–35.7)
MCV: 79 fL (ref 79–97)
Platelets: 355 10*3/uL (ref 150–450)
RBC: 4.94 x10E6/uL (ref 3.77–5.28)
RDW: 14 % (ref 11.7–15.4)
WBC: 9 10*3/uL (ref 3.4–10.8)

## 2019-11-25 LAB — HEPATITIS C ANTIBODY: Hep C Virus Ab: <0.1 s/co ratio (ref 0.0–0.9)

## 2019-11-25 LAB — TSH: TSH: 0.752 u[IU]/mL (ref 0.450–4.500)

## 2019-12-13 ENCOUNTER — Ambulatory Visit: Payer: BC Managed Care – PPO

## 2019-12-20 ENCOUNTER — Ambulatory Visit (INDEPENDENT_AMBULATORY_CARE_PROVIDER_SITE_OTHER): Payer: BC Managed Care – PPO | Admitting: Family Medicine

## 2019-12-20 DIAGNOSIS — R062 Wheezing: Secondary | ICD-10-CM

## 2019-12-20 DIAGNOSIS — U071 COVID-19: Secondary | ICD-10-CM | POA: Diagnosis not present

## 2019-12-20 DIAGNOSIS — F4322 Adjustment disorder with anxiety: Secondary | ICD-10-CM

## 2019-12-20 MED ORDER — BENZONATATE 100 MG PO CAPS: 100.0000 mg | ORAL_CAPSULE | Freq: Three times a day (TID) | ORAL | 0 refills | Status: DC | PRN

## 2019-12-20 MED ORDER — DEXAMETHASONE 4 MG PO TABS: 4.0000 mg | ORAL_TABLET | Freq: Every day | ORAL | 0 refills | Status: AC

## 2019-12-20 MED ORDER — ALBUTEROL SULFATE HFA 108 (90 BASE) MCG/ACT IN AERS: INHALATION_SPRAY | RESPIRATORY_TRACT | 11 refills | Status: DC

## 2019-12-20 MED ORDER — AZITHROMYCIN 250 MG PO TABS: ORAL_TABLET | ORAL | 0 refills | Status: DC

## 2019-12-20 NOTE — Progress Notes (Signed)
Telephone visit  Subjective: CC: mood PCP: Raliegh Ip, DO UQJ:FHLKT J Consalvo is a 49 y.o. female calls for telephone consult today. Patient provides verbal consent for consult held via phone.  Due to COVID-19 pandemic this visit was conducted virtually. This visit type was conducted due to national recommendations for restrictions regarding the COVID-19 Pandemic (e.g. social distancing, sheltering in place) in an effort to limit this patient's exposure and mitigate transmission in our community. All issues noted in this document were discussed and addressed.  A physical exam was not performed with this format.   Location of patient: work/ home Location of provider: [facility] Others present for call: none  1. Mood Patient reports things are better since adjusting her Buspar.  She reports increased drive.  She feels less overwhelmed.  She reports she is under quarantine due to COVID.  Relationship unchanged with daughter. She continues to take Prozac as directed.  2. Covid infection Patient has been sick with a Covid infection in the last couple of days. She is currently isolating at home as above. She is using home medications but continues to have a cough. She reports some dyspnea but nothing severe. Does not report any high fevers, hemoptysis. She has an albuterol inhaler on hand but unsure if this is still in date. She has not seen any providers for this infection yet. She is hydrating without difficulty. Does not report any GI symptoms.  ROS: Per HPI  Allergies  Allergen Reactions  . Codeine Nausea And Vomiting  . Bactrim [Sulfamethoxazole-Trimethoprim] Hives  . Wellbutrin [Bupropion]     extreme irritability   Past Medical History:  Diagnosis Date  . Anxiety   . Depression   . GERD (gastroesophageal reflux disease)   . Hyperlipidemia   . PONV (postoperative nausea and vomiting)    had to use a scop patch 2nd c-sec  . Wears dentures    top    Current Outpatient  Medications:  .  albuterol (PROVENTIL HFA) 108 (90 Base) MCG/ACT inhaler, INHALE 2 PUFFS EVERY 6 (SIX) HOURS AS NEEDED INTO THE LUNGS FOR WHEEZING OR SHORTNESS OF BREATH. (Patient not taking: Reported on 11/24/2019), Disp: 6.7 g, Rfl: 11 .  busPIRone (BUSPAR) 30 MG tablet, Take 1 tablet (30 mg total) by mouth 2 (two) times daily., Disp: 180 tablet, Rfl: 3 .  FLUoxetine (PROZAC) 40 MG capsule, Take 1 capsule (40 mg total) by mouth daily., Disp: 90 capsule, Rfl: 3 .  gabapentin (NEURONTIN) 300 MG capsule, TAKE 1 CAPSULE BY MOUTH EVERYDAY AT BEDTIME, Disp: 90 capsule, Rfl: 3 .  ibuprofen (ADVIL,MOTRIN) 200 MG tablet, Take 200 mg by mouth every 6 (six) hours as needed for moderate pain., Disp: , Rfl:  .  ketoconazole (NIZORAL) 2 % shampoo, Apply 1 application topically 2 (two) times a week. (May leave on for 10 minutes then rinse off) x2 weeks (Patient not taking: Reported on 11/24/2019), Disp: 120 mL, Rfl: 0 .  omeprazole (PRILOSEC) 20 MG capsule, TAKE 1 CAPSULE BY MOUTH EVERY DAY, Disp: 90 capsule, Rfl: 3  Depression screen [facility] 2/9 12/20/2019 11/24/2019 01/24/2019  Decreased Interest 1 1 1   Down, Depressed, Hopeless 0 1 0  PHQ - 2 Score 1 2 1   Altered sleeping 0 1 -  Tired, decreased energy 0 1 -  Change in appetite 0 0 -  Feeling bad or failure about yourself  0 1 -  Trouble concentrating 0 1 -  Moving slowly or fidgety/restless 0 0 -  Suicidal thoughts 0  0 -  PHQ-9 Score 1 6 -  Difficult doing work/chores Not difficult at all Not difficult at all -    Assessment/ Plan: 49 y.o. female   1. COVID-19 virus infection She is currently isolating at home. I am going to place her on azithromycin, Decadron and Tessalon Perles given respiratory symptoms. Continue pushing oral fluids. We discussed red flag signs and symptoms warranting further evaluation. She voiced good understanding - benzonatate (TESSALON PERLES) 100 MG capsule; Take 1 capsule (100 mg total) by mouth 3 (three) times daily as needed.   Dispense: 20 capsule; Refill: 0 - azithromycin (ZITHROMAX) 250 MG tablet; Take 2 tablets today, then take 1 tablet daily until gone.  Dispense: 6 tablet; Refill: 0 - dexamethasone (DECADRON) 4 MG tablet; Take 1 tablet (4 mg total) by mouth daily for 5 days.  Dispense: 5 tablet; Refill: 0  2. Wheezing Albuterol renewed. Use as needed - albuterol (PROVENTIL HFA) 108 (90 Base) MCG/ACT inhaler; INHALE 2 PUFFS EVERY 6 (SIX) HOURS AS NEEDED INTO THE LUNGS FOR WHEEZING OR SHORTNESS OF BREATH.  Dispense: 6.7 g; Refill: 11  3. Adjustment disorder with anxious mood Stable and doing well with current medications. She has refills on all. She may follow-up as needed on this issue or in 6 months.   Start time: 4:02pm End time: 4:10pm  Total time spent on patient care (including telephone call/ virtual visit): 16 minutes  Ashly Hulen Skains, DO [facility] [phone]

## 2019-12-25 DIAGNOSIS — Z20822 Contact with and (suspected) exposure to covid-19: Secondary | ICD-10-CM | POA: Diagnosis not present

## 2020-01-03 ENCOUNTER — Ambulatory Visit: Payer: BLUE CROSS/BLUE SHIELD | Admitting: Gastroenterology

## 2020-01-24 ENCOUNTER — Ambulatory Visit: Payer: BLUE CROSS/BLUE SHIELD | Admitting: Gastroenterology

## 2020-07-12 DIAGNOSIS — Z1231 Encounter for screening mammogram for malignant neoplasm of breast: Secondary | ICD-10-CM | POA: Diagnosis not present

## 2020-11-14 ENCOUNTER — Other Ambulatory Visit: Payer: Self-pay | Admitting: Family Medicine

## 2020-11-26 ENCOUNTER — Other Ambulatory Visit: Payer: Self-pay | Admitting: Family Medicine

## 2020-11-26 DIAGNOSIS — F4322 Adjustment disorder with anxiety: Secondary | ICD-10-CM

## 2020-11-29 ENCOUNTER — Telehealth: Payer: Self-pay | Admitting: Family Medicine

## 2020-11-29 DIAGNOSIS — F4322 Adjustment disorder with anxiety: Secondary | ICD-10-CM

## 2020-11-29 MED ORDER — FLUOXETINE HCL 40 MG PO CAPS: 40.0000 mg | ORAL_CAPSULE | Freq: Every day | ORAL | 0 refills | Status: DC

## 2020-11-29 NOTE — Telephone Encounter (Signed)
  Prescription Request  11/29/2020  Is this a "Controlled Substance" medicine? Have you seen your PCP in the last 2 weeks? If YES, route message to pool  -  If NO, patient needs to be seen.  What is the name of the medication or equipment? FLUoxetine (PROZAC) 40 MG capsule  Have you contacted your pharmacy to request a refill? yes  [facility] [place]    *Pt is aware that this medicine may not be called in but she wanted a message put in either way. Made appt for pt for next available with PCP   Patient notified that their request is being sent to the clinical staff for review and that they should receive a response within 2 business days.

## 2020-12-07 ENCOUNTER — Other Ambulatory Visit: Payer: Self-pay | Admitting: Family Medicine

## 2020-12-11 ENCOUNTER — Other Ambulatory Visit: Payer: Self-pay | Admitting: Family Medicine

## 2020-12-21 ENCOUNTER — Other Ambulatory Visit: Payer: Self-pay | Admitting: Family Medicine

## 2020-12-21 DIAGNOSIS — F4322 Adjustment disorder with anxiety: Secondary | ICD-10-CM

## 2020-12-25 ENCOUNTER — Other Ambulatory Visit: Payer: Self-pay

## 2020-12-25 ENCOUNTER — Encounter: Payer: Self-pay | Admitting: Family Medicine

## 2020-12-25 ENCOUNTER — Ambulatory Visit: Payer: BC Managed Care – PPO | Admitting: Family Medicine

## 2020-12-25 ENCOUNTER — Other Ambulatory Visit: Payer: Self-pay | Admitting: Family Medicine

## 2020-12-25 VITALS — BP 130/80 | HR 73 | Temp 98.2°F | Ht 59.5 in | Wt 198.8 lb

## 2020-12-25 DIAGNOSIS — E7800 Pure hypercholesterolemia, unspecified: Secondary | ICD-10-CM | POA: Diagnosis not present

## 2020-12-25 DIAGNOSIS — F4322 Adjustment disorder with anxiety: Secondary | ICD-10-CM

## 2020-12-25 DIAGNOSIS — N951 Menopausal and female climacteric states: Secondary | ICD-10-CM

## 2020-12-25 DIAGNOSIS — R7303 Prediabetes: Secondary | ICD-10-CM

## 2020-12-25 DIAGNOSIS — K219 Gastro-esophageal reflux disease without esophagitis: Secondary | ICD-10-CM

## 2020-12-25 LAB — BAYER DCA HB A1C WAIVED: HB A1C (BAYER DCA - WAIVED): 5.6 % (ref 4.8–5.6)

## 2020-12-25 MED ORDER — GABAPENTIN 300 MG PO CAPS: ORAL_CAPSULE | ORAL | 3 refills | Status: DC

## 2020-12-25 MED ORDER — OMEPRAZOLE 20 MG PO CPDR: DELAYED_RELEASE_CAPSULE | ORAL | 3 refills | Status: DC

## 2020-12-25 MED ORDER — FLUOXETINE HCL 20 MG PO CAPS: 60.0000 mg | ORAL_CAPSULE | Freq: Every day | ORAL | 3 refills | Status: DC

## 2020-12-25 MED ORDER — BUSPIRONE HCL 30 MG PO TABS: 30.0000 mg | ORAL_TABLET | Freq: Two times a day (BID) | ORAL | 3 refills | Status: DC

## 2020-12-25 NOTE — Patient Instructions (Signed)
Fluoxetine has been increased to 60 mg daily.  Continue buspirone twice daily as directed Consider scheduling an appoint with [facility] for genetic testing for mental health medications if you do not find the fluoxetine to be especially helpful.  We may be able to find something that is better suited to you. I would give the new dose at least 4 weeks before making the switch.

## 2020-12-25 NOTE — Progress Notes (Signed)
Subjective: CC: Follow-up anxiety and depression PCP: Janora Norlander, DO Tracy [name] is a 50 y.o. female presenting to clinic today for:  1.  Anxiety and depression Patient is compliant with Prozac 40 mg daily, BuSpar 30 mg twice daily.  She feels "flat" and does not feel like she has much motivation as of late.  Has been intolerant to Wellbutrin in past due to extreme irritability.  Would like to see if she can advance her Prozac or BuSpar dose today to combat this.  2.  GERD Patient reports use of Prilosec every other day.  Symptoms are stable.  No reports of GI bleeding.  He is overdue for colonoscopy and will get this rescheduled as she was ill with COVID when she was supposed to have this done last year.  3.  Hot flashes These are stable with gabapentin nightly.  Needs refills  ROS: Per HPI  Allergies  Allergen Reactions   Codeine Nausea And Vomiting   Bactrim [Sulfamethoxazole-Trimethoprim] Hives   Wellbutrin [Bupropion]     extreme irritability   Past Medical History:  Diagnosis Date   Anxiety    Depression    GERD (gastroesophageal reflux disease)    Hyperlipidemia    PONV (postoperative nausea and vomiting)    had to use a scop patch 2nd c-sec   Wears dentures    top    Current Outpatient Medications:    albuterol (PROVENTIL HFA) 108 (90 Base) MCG/ACT inhaler, INHALE 2 PUFFS EVERY 6 (SIX) HOURS AS NEEDED INTO THE LUNGS FOR WHEEZING OR SHORTNESS OF BREATH., Disp: 6.7 g, Rfl: 11   azithromycin (ZITHROMAX) 250 MG tablet, Take 2 tablets today, then take 1 tablet daily until gone., Disp: 6 tablet, Rfl: 0   busPIRone (BUSPAR) 30 MG tablet, Take 1 tablet (30 mg total) by mouth 2 (two) times daily., Disp: 60 tablet, Rfl: 0   FLUoxetine (PROZAC) 40 MG capsule, Take 1 capsule (40 mg total) by mouth daily., Disp: 30 capsule, Rfl: 0   gabapentin (NEURONTIN) 300 MG capsule, TAKE 1 CAPSULE BY MOUTH EVERYDAY AT BEDTIME, Disp: 90 capsule, Rfl: 3   ibuprofen  (ADVIL,MOTRIN) 200 MG tablet, Take 200 mg by mouth every 6 (six) hours as needed for moderate pain., Disp: , Rfl:    omeprazole (PRILOSEC) 20 MG capsule, TAKE 1 CAPSULE BY MOUTH EVERY DAY, Disp: 90 capsule, Rfl: 3 Social History   Socioeconomic History   Marital status: Single    Spouse name: Not on file   Number of children: Not on file   Years of education: Not on file   Highest education level: Not on file  Occupational History   Not on file  Tobacco Use   Smoking status: Every Day    Packs/day: 1.00    Types: Cigarettes   Smokeless tobacco: Never  Vaping Use   Vaping Use: Never used  Substance and Sexual Activity   Alcohol use: No    Comment: rare   Drug use: No   Sexual activity: Yes    Birth control/protection: None  Other Topics Concern   Not on file  Social History Narrative   Not on file   Social Determinants of Health   Financial Resource Strain: Not on file  Food Insecurity: Not on file  Transportation Needs: Not on file  Physical Activity: Not on file  Stress: Not on file  Social Connections: Not on file  Intimate Partner Violence: Not on file   Family History  Problem Relation Age  of Onset   COPD Mother    Heart disease Mother    Heart disease Father    Diabetes Father    Heart disease Brother    Breast cancer Other        aunt   Colon cancer Other        great uncle   Lung cancer Other        uncle   Esophageal cancer Other        grandfather    Objective: Office vital signs reviewed. BP 130/80   Pulse 73   Temp 98.2 F (36.8 C)   Ht 4' 11.5" (1.511 m)   Wt 198 lb 12.8 oz (90.2 kg)   LMP 08/31/2016   SpO2 98%   BMI 39.48 kg/m   Physical Examination:  General: Awake, alert, obese, No acute distress HEENT: Normal; sclera white Cardio: regular rate and rhythm, S1S2 heard, no murmurs appreciated Pulm: clear to auscultation bilaterally, no wheezes, rhonchi or rales; normal work of breathing on room air MSK: Ambulating  independently Psych: Mood flat.  Patient is very pleasant.  Depression screen [facility] 2/9 12/20/2019 11/24/2019 01/24/2019  Decreased Interest _0 Down, Depressed, Hopeless 0 1 0  PHQ - 2 Score _1 Altered sleeping 0 1 -  Tired, decreased energy 0 1 -  Change in appetite 0 0 -  Feeling bad or failure about yourself  0 1 -  Trouble concentrating 0 1 -  Moving slowly or fidgety/restless 0 0 -  Suicidal thoughts 0 0 -  PHQ-9 Score 1 6 -  Difficult doing work/chores Not difficult at all Not difficult at all -   GAD 7 : Generalized Anxiety Score 11/24/2019 01/24/2019 12/21/2018 10/12/2018  Nervous, Anxious, on Edge _2 Control/stop worrying 1 0 0 2  Worry too much - different things 1 0 1 3  Trouble relaxing _3 Restless _4 0  Easily annoyed or irritable 1 0 0 3  Afraid - awful might happen 1 0 0 1  Total GAD 7 Score _5 Anxiety Difficulty Somewhat difficult Somewhat difficult Somewhat difficult Somewhat difficult     Assessment/ Plan: 50 y.o. female   Adjustment disorder with anxious mood - Plan: busPIRone (BUSPAR) 30 MG tablet, FLUoxetine (PROZAC) 20 MG capsule, CBC  Gastroesophageal reflux disease without esophagitis - Plan: omeprazole (PRILOSEC) 20 MG capsule, CBC  Hot flashes due to menopause - Plan: gabapentin (NEURONTIN) 300 MG capsule, TSH, T4, Free  Pre-diabetes - Plan: Bayer DCA Hb A1c Waived  Morbid obesity (HCC) - Plan: CMP14+EGFR, Lipid Panel, TSH, T4, Free, Bayer DCA Hb A1c Waived  Pure hypercholesterolemia - Plan: CMP14+EGFR, Lipid Panel, TSH  Anxiety and depression symptoms are not well controlled currently.  Would like to advance dose and I have increased her to 60 mg of Prozac.  We will continue BuSpar 30 mg twice daily.  I given her information on GeneSight testing and she will schedule if needed.  GERD is stable with every other day use.  PPI renewed.  Check CBC  Hot flashes stable with gabapentin.  This has been renewed.  Noted to be  prediabetic with A1c of 5.8 last year.  We will recheck this.  Check fasting lipid panel, CMP as well.  Not currently treated with cholesterol-lowering medication  No orders of the defined types were placed in this encounter.  No orders of the defined types were placed  in this encounter.    Janora Norlander, DO [place] [phone]

## 2020-12-26 LAB — CBC
Hematocrit: 39.5 % (ref 34.0–46.6)
Hemoglobin: 13 g/dL (ref 11.1–15.9)
MCH: 26.4 pg — ABNORMAL LOW (ref 26.6–33.0)
MCHC: 32.9 g/dL (ref 31.5–35.7)
MCV: 80 fL (ref 79–97)
Platelets: 322 10*3/uL (ref 150–450)
RBC: 4.92 x10E6/uL (ref 3.77–5.28)
RDW: 13.7 % (ref 11.7–15.4)
WBC: 9.4 10*3/uL (ref 3.4–10.8)

## 2020-12-26 LAB — CMP14+EGFR
ALT: 18 IU/L (ref 0–32)
AST: 14 IU/L (ref 0–40)
Albumin/Globulin Ratio: 1.7 (ref 1.2–2.2)
Albumin: 4.2 g/dL (ref 3.8–4.8)
Alkaline Phosphatase: 65 IU/L (ref 44–121)
BUN/Creatinine Ratio: 9 (ref 9–23)
BUN: 8 mg/dL (ref 6–24)
Bilirubin Total: <0.2 mg/dL (ref 0.0–1.2)
CO2: 23 mmol/L (ref 20–29)
Calcium: 9.5 mg/dL (ref 8.7–10.2)
Chloride: 101 mmol/L (ref 96–106)
Creatinine, Ser: 0.91 mg/dL (ref 0.57–1.00)
Globulin, Total: 2.5 g/dL (ref 1.5–4.5)
Glucose: 124 mg/dL — ABNORMAL HIGH (ref 65–99)
Potassium: 4.3 mmol/L (ref 3.5–5.2)
Sodium: 139 mmol/L (ref 134–144)
Total Protein: 6.7 g/dL (ref 6.0–8.5)
eGFR: 78 mL/min/{1.73_m2} (ref >59)

## 2020-12-26 LAB — LIPID PANEL
Chol/HDL Ratio: 4.1 ratio (ref 0.0–4.4)
Cholesterol, Total: 210 mg/dL — ABNORMAL HIGH (ref 100–199)
HDL: 51 mg/dL (ref >39)
LDL Chol Calc (NIH): 142 mg/dL — ABNORMAL HIGH (ref 0–99)
Triglycerides: 97 mg/dL (ref 0–149)
VLDL Cholesterol Cal: 17 mg/dL (ref 5–40)

## 2020-12-26 LAB — TSH: TSH: 0.944 u[IU]/mL (ref 0.450–4.500)

## 2020-12-26 LAB — T4, FREE: Free T4: 0.96 ng/dL (ref 0.82–1.77)

## 2021-01-16 ENCOUNTER — Other Ambulatory Visit: Payer: Self-pay | Admitting: Family Medicine

## 2021-01-16 DIAGNOSIS — F4322 Adjustment disorder with anxiety: Secondary | ICD-10-CM

## 2021-04-25 ENCOUNTER — Other Ambulatory Visit: Payer: Self-pay | Admitting: Family Medicine

## 2021-04-25 DIAGNOSIS — F4322 Adjustment disorder with anxiety: Secondary | ICD-10-CM

## 2021-05-19 ENCOUNTER — Other Ambulatory Visit: Payer: Self-pay | Admitting: Family Medicine

## 2021-05-19 DIAGNOSIS — F4322 Adjustment disorder with anxiety: Secondary | ICD-10-CM

## 2021-07-18 DIAGNOSIS — Z1231 Encounter for screening mammogram for malignant neoplasm of breast: Secondary | ICD-10-CM | POA: Diagnosis not present

## 2021-07-24 ENCOUNTER — Other Ambulatory Visit: Payer: Self-pay | Admitting: Family Medicine

## 2021-07-24 DIAGNOSIS — F4322 Adjustment disorder with anxiety: Secondary | ICD-10-CM

## 2021-07-24 DIAGNOSIS — K219 Gastro-esophageal reflux disease without esophagitis: Secondary | ICD-10-CM

## 2021-07-25 ENCOUNTER — Other Ambulatory Visit: Payer: Self-pay | Admitting: Family Medicine

## 2021-07-25 DIAGNOSIS — K219 Gastro-esophageal reflux disease without esophagitis: Secondary | ICD-10-CM

## 2021-08-16 ENCOUNTER — Other Ambulatory Visit: Payer: Self-pay | Admitting: Family Medicine

## 2021-08-16 DIAGNOSIS — F4322 Adjustment disorder with anxiety: Secondary | ICD-10-CM

## 2021-08-18 ENCOUNTER — Encounter: Payer: Self-pay | Admitting: Family Medicine

## 2021-08-18 NOTE — Telephone Encounter (Signed)
30 days given on 07/25/2021 - ntbs  ?

## 2021-08-18 NOTE — Telephone Encounter (Signed)
No answer. Sent letter. °

## 2021-10-08 ENCOUNTER — Other Ambulatory Visit: Payer: Self-pay | Admitting: Family Medicine

## 2021-10-08 DIAGNOSIS — K219 Gastro-esophageal reflux disease without esophagitis: Secondary | ICD-10-CM

## 2021-10-08 DIAGNOSIS — F4322 Adjustment disorder with anxiety: Secondary | ICD-10-CM

## 2021-10-10 ENCOUNTER — Telehealth: Payer: Self-pay | Admitting: Family Medicine

## 2021-10-10 ENCOUNTER — Other Ambulatory Visit: Payer: Self-pay | Admitting: Family Medicine

## 2021-10-10 DIAGNOSIS — F4322 Adjustment disorder with anxiety: Secondary | ICD-10-CM

## 2021-10-10 NOTE — Telephone Encounter (Signed)
  Prescription Request  10/10/2021  Is this a "Controlled Substance" medicine? no  Have you seen your PCP in the last 2 weeks? no  If YES, route message to pool  -  If NO, patient needs to be scheduled for appointment.  What is the name of the medication or equipment? Fluoxetine 20 mg  Have you contacted your pharmacy to request a refill? yes   Which pharmacy would you like this sent to? [facility] in [place]   Patient notified that their request is being sent to the clinical staff for review and that they should receive a response within 2 business days.

## 2021-10-13 MED ORDER — FLUOXETINE HCL 20 MG PO CAPS: 60.0000 mg | ORAL_CAPSULE | Freq: Every day | ORAL | 0 refills | Status: DC

## 2021-10-15 ENCOUNTER — Other Ambulatory Visit: Payer: Self-pay | Admitting: Family Medicine

## 2021-10-15 DIAGNOSIS — F4322 Adjustment disorder with anxiety: Secondary | ICD-10-CM

## 2021-11-02 ENCOUNTER — Other Ambulatory Visit: Payer: Self-pay | Admitting: Family Medicine

## 2021-11-02 DIAGNOSIS — F4322 Adjustment disorder with anxiety: Secondary | ICD-10-CM

## 2021-11-02 DIAGNOSIS — K219 Gastro-esophageal reflux disease without esophagitis: Secondary | ICD-10-CM

## 2021-11-13 ENCOUNTER — Telehealth: Payer: Self-pay | Admitting: Family Medicine

## 2021-11-13 NOTE — Telephone Encounter (Signed)
Please add future lab order for patient. She is coming first thing tomorrow morning to do her labs instead of waiting until her appt at 11:15.

## 2021-11-13 NOTE — Telephone Encounter (Signed)
Please review and advise.

## 2021-11-14 ENCOUNTER — Ambulatory Visit: Payer: BC Managed Care – PPO | Admitting: Family Medicine

## 2021-11-14 ENCOUNTER — Other Ambulatory Visit: Payer: Self-pay | Admitting: Family Medicine

## 2021-11-14 ENCOUNTER — Ambulatory Visit (INDEPENDENT_AMBULATORY_CARE_PROVIDER_SITE_OTHER): Payer: BC Managed Care – PPO | Admitting: Family Medicine

## 2021-11-14 ENCOUNTER — Encounter: Payer: Self-pay | Admitting: Family Medicine

## 2021-11-14 VITALS — BP 131/77 | HR 85 | Temp 98.1°F | Ht 59.5 in | Wt 200.0 lb

## 2021-11-14 DIAGNOSIS — E7800 Pure hypercholesterolemia, unspecified: Secondary | ICD-10-CM

## 2021-11-14 DIAGNOSIS — Z720 Tobacco use: Secondary | ICD-10-CM

## 2021-11-14 DIAGNOSIS — N951 Menopausal and female climacteric states: Secondary | ICD-10-CM

## 2021-11-14 DIAGNOSIS — Z0001 Encounter for general adult medical examination with abnormal findings: Secondary | ICD-10-CM

## 2021-11-14 DIAGNOSIS — F4322 Adjustment disorder with anxiety: Secondary | ICD-10-CM

## 2021-11-14 DIAGNOSIS — Z0000 Encounter for general adult medical examination without abnormal findings: Secondary | ICD-10-CM

## 2021-11-14 DIAGNOSIS — R062 Wheezing: Secondary | ICD-10-CM

## 2021-11-14 DIAGNOSIS — R7303 Prediabetes: Secondary | ICD-10-CM | POA: Diagnosis not present

## 2021-11-14 DIAGNOSIS — K219 Gastro-esophageal reflux disease without esophagitis: Secondary | ICD-10-CM

## 2021-11-14 LAB — BAYER DCA HB A1C WAIVED: HB A1C (BAYER DCA - WAIVED): 5.5 % (ref 4.8–5.6)

## 2021-11-14 MED ORDER — FLUOXETINE HCL 20 MG PO CAPS
40.0000 mg | ORAL_CAPSULE | Freq: Every day | ORAL | 3 refills | Status: DC
Start: 2021-11-14 — End: 2021-12-08

## 2021-11-14 MED ORDER — BUSPIRONE HCL 30 MG PO TABS: 30.0000 mg | ORAL_TABLET | Freq: Two times a day (BID) | ORAL | 3 refills | Status: DC

## 2021-11-14 MED ORDER — OMEPRAZOLE 20 MG PO CPDR: DELAYED_RELEASE_CAPSULE | ORAL | 3 refills | Status: DC

## 2021-11-14 MED ORDER — GABAPENTIN 300 MG PO CAPS
ORAL_CAPSULE | ORAL | 3 refills | Status: DC
Start: 2021-11-14 — End: 2022-04-21

## 2021-11-14 MED ORDER — ALBUTEROL SULFATE HFA 108 (90 BASE) MCG/ACT IN AERS: INHALATION_SPRAY | RESPIRATORY_TRACT | 1 refills | Status: DC

## 2021-11-14 NOTE — Telephone Encounter (Signed)
Looks like she will have to get OTC

## 2021-11-14 NOTE — Patient Instructions (Signed)
Here is a guide to help [place] find out which weight loss medications will be covered by your insurance plan.  Please check out this web site  [employer] and follow the 3 simple steps.   There is also a phone number you can call if you do not have access to the Internet. [phone] (Monday- Friday 8am-8pm)  [employer] provides coverage information for more than 80% of the inquiries submitted!!   Preventive Care 40-49 Years Old, Female Preventive care refers to lifestyle choices and visits with your health care provider that can promote health and wellness. Preventive care visits are also called wellness exams. What can I expect for my preventive care visit? Counseling Your health care provider may ask you questions about your: Medical history, including: Past medical problems. Family medical history. Pregnancy history. Current health, including: Menstrual cycle. Method of birth control. Emotional well-being. Home life and relationship well-being. Sexual activity and sexual health. Lifestyle, including: Alcohol, nicotine or tobacco, and drug use. Access to firearms. Diet, exercise, and sleep habits. Work and work Statistician. Sunscreen use. Safety issues such as seatbelt and bike helmet use. Physical exam Your health care provider will check your: Height and weight. These may be used to calculate your BMI (body mass index). BMI is a measurement that tells if you are at a healthy weight. Waist circumference. This measures the distance around your waistline. This measurement also tells if you are at a healthy weight and may help predict your risk of certain diseases, such as type 2 diabetes and high blood pressure. Heart rate and blood pressure. Body temperature. Skin for abnormal spots. What immunizations do I need?  Vaccines are usually given at various ages, according to a schedule. Your health care provider will recommend vaccines for you based on your age, medical history,  and lifestyle or other factors, such as travel or where you work. What tests do I need? Screening Your health care provider may recommend screening tests for certain conditions. This may include: Lipid and cholesterol levels. Diabetes screening. This is done by checking your blood sugar (glucose) after you have not eaten for a while (fasting). Pelvic exam and Pap test. Hepatitis B test. Hepatitis C test. HIV (human immunodeficiency virus) test. STI (sexually transmitted infection) testing, if you are at risk. Lung cancer screening. Colorectal cancer screening. Mammogram. Talk with your health care provider about when you should start having regular mammograms. This may depend on whether you have a family history of breast cancer. BRCA-related cancer screening. This may be done if you have a family history of breast, ovarian, tubal, or peritoneal cancers. Bone density scan. This is done to screen for osteoporosis. Talk with your health care provider about your test results, treatment options, and if necessary, the need for more tests. Follow these instructions at home: Eating and drinking  Eat a diet that includes fresh fruits and vegetables, whole grains, lean protein, and low-fat dairy products. Take vitamin and mineral supplements as recommended by your health care provider. Do not drink alcohol if: Your health care provider tells you not to drink. You are pregnant, may be pregnant, or are planning to become pregnant. If you drink alcohol: Limit how much you have to 0-1 drink a day. Know how much alcohol is in your drink. In the [place] one drink equals one 12 oz bottle of beer (355 mL), one 5 oz glass of wine (148 mL), or one 1 oz glass of hard liquor (44 mL). Lifestyle Brush your teeth every morning  and night with fluoride toothpaste. Floss one time each day. Exercise for at least 30 minutes 5 or more days each week. Do not use any products that contain nicotine or tobacco. These  products include cigarettes, chewing tobacco, and vaping devices, such as e-cigarettes. If you need help quitting, ask your health care provider. Do not use drugs. If you are sexually active, practice safe sex. Use a condom or other form of protection to prevent STIs. If you do not wish to become pregnant, use a form of birth control. If you plan to become pregnant, see your health care provider for a prepregnancy visit. Take aspirin only as told by your health care provider. Make sure that you understand how much to take and what form to take. Work with your health care provider to find out whether it is safe and beneficial for you to take aspirin daily. Find healthy ways to manage stress, such as: Meditation, yoga, or listening to music. Journaling. Talking to a trusted person. Spending time with friends and family. Minimize exposure to UV radiation to reduce your risk of skin cancer. Safety Always wear your seat belt while driving or riding in a vehicle. Do not drive: If you have been drinking alcohol. Do not ride with someone who has been drinking. When you are tired or distracted. While texting. If you have been using any mind-altering substances or drugs. Wear a helmet and other protective equipment during sports activities. If you have firearms in your house, make sure you follow all gun safety procedures. Seek help if you have been physically or sexually abused. What's next? Visit your health care provider once a year for an annual wellness visit. Ask your health care provider how often you should have your eyes and teeth checked. Stay up to date on all vaccines. This information is not intended to replace advice given to you by your health care provider. Make sure you discuss any questions you have with your health care provider. Document Revised: 09/25/2020 Document Reviewed: 09/25/2020 Elsevier Patient Education  [place].

## 2021-11-14 NOTE — Progress Notes (Signed)
Tracy [name] is a 51 y.o. female presents to office today for annual physical exam examination.    Concerns today include: Admits to some weight gain but notes that she has a lot of stress going on.  She does not feel that she overeats nor eats things are super calorically dense.  She is not currently exercising regularly  Substance use: smokes  Diet: A lot of coffee, Exercise: No structured Last eye exam: Up-to-date Last dental exam: >1 year Last colonoscopy: Declines Last mammogram: Up-to-date until April 2024 Last pap smear: Needs Refills needed today: all Immunizations needed: Immunization History  Administered Date(s) Administered   Influenza,inj,Quad PF,6+ Mos 02/14/2013   Moderna Sars-Covid-2 Vaccination 01/07/2020     Past Medical History:  Diagnosis Date   Anxiety    Depression    GERD (gastroesophageal reflux disease)    Hyperlipidemia    PONV (postoperative nausea and vomiting)    had to use a scop patch 2nd c-sec   Wears dentures    top   Social History   Socioeconomic History   Marital status: Single    Spouse name: Not on file   Number of children: Not on file   Years of education: Not on file   Highest education level: Not on file  Occupational History   Not on file  Tobacco Use   Smoking status: Every Day    Packs/day: 1.00    Types: Cigarettes   Smokeless tobacco: Never  Vaping Use   Vaping Use: Never used  Substance and Sexual Activity   Alcohol use: No    Comment: rare   Drug use: No   Sexual activity: Yes    Birth control/protection: None  Other Topics Concern   Not on file  Social History Narrative   Not on file   Social Determinants of Health   Financial Resource Strain: Not on file  Food Insecurity: Not on file  Transportation Needs: Not on file  Physical Activity: Not on file  Stress: Not on file  Social Connections: Not on file  Intimate Partner Violence: Not on file   Past Surgical History:  Procedure Laterality  Date   CESAREAN SECTION  [id number]   Two   CHOLECYSTECTOMY N/A 07/12/2013   Procedure: LAPAROSCOPIC CHOLECYSTECTOMY WITH INTRAOPERATIVE CHOLANGIOGRAM;  Surgeon: Marcello Moores A. Cornett, MD;  Location: [facility];  Service: General;  Laterality: N/A;   LAPAROSCOPIC APPENDECTOMY N/A 02/07/2016   Procedure: APPENDECTOMY LAPAROSCOPIC;  Surgeon: Erroll Luna, MD;  Location: [facility];  Service: General;  Laterality: N/A;   MULTIPLE TOOTH EXTRACTIONS     Family History  Problem Relation Age of Onset   COPD Mother    Heart disease Mother    Heart disease Father    Diabetes Father    Heart disease Brother    Breast cancer Other        aunt   Colon cancer Other        great uncle   Lung cancer Other        uncle   Esophageal cancer Other        grandfather    Current Outpatient Medications:    ibuprofen (ADVIL,MOTRIN) 200 MG tablet, Take 200 mg by mouth every 6 (six) hours as needed for moderate pain., Disp: , Rfl:    albuterol (PROVENTIL HFA) 108 (90 Base) MCG/ACT inhaler, INHALE 2 PUFFS EVERY 6 (SIX) HOURS AS NEEDED INTO THE LUNGS FOR WHEEZING OR SHORTNESS OF BREATH., Disp: 6.7 g, Rfl: 1  busPIRone (BUSPAR) 30 MG tablet, Take 1 tablet (30 mg total) by mouth 2 (two) times daily., Disp: 180 tablet, Rfl: 3   FLUoxetine (PROZAC) 20 MG capsule, Take 2 capsules (40 mg total) by mouth daily., Disp: 180 capsule, Rfl: 3   gabapentin (NEURONTIN) 300 MG capsule, TAKE 1 CAPSULE BY MOUTH EVERYDAY AT BEDTIME, Disp: 90 capsule, Rfl: 3   omeprazole (PRILOSEC) 20 MG capsule, TAKE 1 CAPSULE BY MOUTH EVERY DAY, Disp: 90 capsule, Rfl: 3  Allergies  Allergen Reactions   Codeine Nausea And Vomiting   Bactrim [Sulfamethoxazole-Trimethoprim] Hives   Wellbutrin [Bupropion]     extreme irritability     ROS: Review of Systems Pertinent items noted in HPI and remainder of comprehensive ROS otherwise negative.    Physical exam BP 131/77   Pulse 85   Temp 98.1 F (36.7 C)   Ht 4' 11.5" (1.511 m)    Wt 200 lb (90.7 kg)   LMP 08/31/2016   SpO2 99%   BMI 39.72 kg/m  General appearance: alert, cooperative, appears stated age, and no distress; morbidly obese Head: Normocephalic, without obvious abnormality, atraumatic Eyes: negative findings: lids and lashes normal, conjunctivae and sclerae normal, corneas clear, and pupils equal, round, reactive to light and accomodation Ears: normal TM's and external ear canals both ears Nose: Nares normal. Septum midline. Mucosa normal. No drainage or sinus tenderness. Throat:  Poor dentition on the lower mouth.  False uppers Neck: no adenopathy, no carotid bruit, supple, symmetrical, trachea midline, and thyroid not enlarged, symmetric, no tenderness/mass/nodules Back: symmetric, no curvature. ROM normal. No CVA tenderness. Lungs: clear to auscultation bilaterally Heart: regular rate and rhythm, S1, S2 normal, no murmur, click, rub or gallop Abdomen: soft, non-tender; bowel sounds normal; no masses,  no organomegaly Extremities: extremities normal, atraumatic, no cyanosis or edema Pulses: 2+ and symmetric Skin: Skin color, texture, turgor normal. No rashes or lesions Lymph nodes: Cervical, supraclavicular, and axillary nodes normal. Neurologic: Grossly normal Psych: Depressed, stressed appearing     11/14/2021   11:54 AM 12/25/2020    8:32 AM 12/20/2019    4:04 PM  Depression screen PHQ 2/9  Decreased Interest 2 2 1   Down, Depressed, Hopeless 2 0 0  PHQ - 2 Score 4 2 1   Altered sleeping 2 3 0  Tired, decreased energy 2 1 0  Change in appetite 1 0 0  Feeling bad or failure about yourself  2 1 0  Trouble concentrating 0 1 0  Moving slowly or fidgety/restless 1 0 0  Suicidal thoughts 0 0 0  PHQ-9 Score 12 8 1   Difficult doing work/chores Somewhat difficult Somewhat difficult Not difficult at all      11/14/2021   11:55 AM 12/25/2020    8:32 AM 11/24/2019    8:42 AM 01/24/2019    8:31 AM  GAD 7 : Generalized Anxiety Score  Nervous, Anxious,  on Edge 2 0 1 1  Control/stop worrying 1 0 1 0  Worry too much - different things 1 0 1 0  Trouble relaxing 2 0 1 1  Restless 1 0 1 1  Easily annoyed or irritable 2 1 1  0  Afraid - awful might happen 1 1 1  0  Total GAD 7 Score 10 2 7 3   Anxiety Difficulty Somewhat difficult Somewhat difficult Somewhat difficult Somewhat difficult   Assessment/ Plan: Tracy [name] here for annual physical exam.   Annual physical exam  Pre-diabetes - Plan: Bayer DCA Hb A1c Waived  Morbid obesity (  [place]) - Plan: TSH, Lipid panel, CMP14+EGFR  Pure hypercholesterolemia - Plan: TSH, Lipid panel, CMP14+EGFR  Tobacco use - Plan: CBC  Adjustment disorder with anxious mood - Plan: busPIRone (BUSPAR) 30 MG tablet, FLUoxetine (PROZAC) 20 MG capsule  Wheezing - Plan: albuterol (PROVENTIL HFA) 108 (90 Base) MCG/ACT inhaler  Hot flashes due to menopause - Plan: gabapentin (NEURONTIN) 300 MG capsule  Gastroesophageal reflux disease without esophagitis - Plan: omeprazole (PRILOSEC) 20 MG capsule  Fasting labs have been collected today.  Patient continues to smoke and she is contemplative about cessation  Symptoms of anxiety and depression are only somewhat controlled with BuSpar and fluoxetine.  She wishes to proceed with GeneSight testing and we will get her set up for this  Gabapentin, albuterol and Prilosec were renewed today as the symptoms have been stable with current medications.  We talked about weight loss options including GLP, which she has no apparent contraindications to.  She will let me know if she wishes to proceed with any of these therapies   Counseled on healthy lifestyle choices, including diet (rich in fruits, vegetables and lean meats and low in salt and simple carbohydrates) and exercise (at least 30 minutes of moderate physical activity daily).  Patient to follow up in 1 year for annual exam or sooner if needed.  Tracy [name] M. Lajuana Ripple, DO

## 2021-11-14 NOTE — Telephone Encounter (Signed)
done

## 2021-11-15 LAB — CMP14+EGFR
ALT: 12 IU/L (ref 0–32)
AST: 14 IU/L (ref 0–40)
Albumin/Globulin Ratio: 1.7 (ref 1.2–2.2)
Albumin: 4.4 g/dL (ref 3.9–4.9)
Alkaline Phosphatase: 74 IU/L (ref 44–121)
BUN/Creatinine Ratio: 6 — ABNORMAL LOW (ref 9–23)
BUN: 5 mg/dL — ABNORMAL LOW (ref 6–24)
Bilirubin Total: 0.2 mg/dL (ref 0.0–1.2)
CO2: 24 mmol/L (ref 20–29)
Calcium: 9.5 mg/dL (ref 8.7–10.2)
Chloride: 100 mmol/L (ref 96–106)
Creatinine, Ser: 0.83 mg/dL (ref 0.57–1.00)
Globulin, Total: 2.6 g/dL (ref 1.5–4.5)
Glucose: 102 mg/dL — ABNORMAL HIGH (ref 70–99)
Potassium: 4.5 mmol/L (ref 3.5–5.2)
Sodium: 140 mmol/L (ref 134–144)
Total Protein: 7 g/dL (ref 6.0–8.5)
eGFR: 86 mL/min/{1.73_m2} (ref >59)

## 2021-11-15 LAB — CBC
Hematocrit: 40.8 % (ref 34.0–46.6)
Hemoglobin: 13.2 g/dL (ref 11.1–15.9)
MCH: 26.5 pg — ABNORMAL LOW (ref 26.6–33.0)
MCHC: 32.4 g/dL (ref 31.5–35.7)
MCV: 82 fL (ref 79–97)
Platelets: 398 10*3/uL (ref 150–450)
RBC: 4.99 x10E6/uL (ref 3.77–5.28)
RDW: 13.5 % (ref 11.7–15.4)
WBC: 11.1 10*3/uL — ABNORMAL HIGH (ref 3.4–10.8)

## 2021-11-15 LAB — LIPID PANEL
Chol/HDL Ratio: 5.7 ratio — ABNORMAL HIGH (ref 0.0–4.4)
Cholesterol, Total: 256 mg/dL — ABNORMAL HIGH (ref 100–199)
HDL: 45 mg/dL (ref >39)
LDL Chol Calc (NIH): 189 mg/dL — ABNORMAL HIGH (ref 0–99)
Triglycerides: 124 mg/dL (ref 0–149)
VLDL Cholesterol Cal: 22 mg/dL (ref 5–40)

## 2021-11-15 LAB — TSH: TSH: 1.59 u[IU]/mL (ref 0.450–4.500)

## 2021-11-19 ENCOUNTER — Ambulatory Visit (INDEPENDENT_AMBULATORY_CARE_PROVIDER_SITE_OTHER): Payer: BC Managed Care – PPO | Admitting: Family Medicine

## 2021-11-19 ENCOUNTER — Encounter: Payer: Self-pay | Admitting: Family Medicine

## 2021-11-19 VITALS — BP 132/75 | HR 88 | Temp 97.8°F | Ht 59.5 in | Wt 200.6 lb

## 2021-11-19 DIAGNOSIS — F4322 Adjustment disorder with anxiety: Secondary | ICD-10-CM | POA: Diagnosis not present

## 2021-11-19 NOTE — Progress Notes (Signed)
Assessment & Plan:  1. Adjustment disorder with anxious mood Uncontrolled.  GeneSight testing completed today.   Follow up plan: Return if symptoms worsen or fail to improve.  Deliah Boston, MSN, APRN, FNP-C [facility]  Subjective:   Patient ID: Tracy [name], female    DOB: 19-Jun-1970, 51 y.o.   MRN: [id number]  HPI: Tracy [name] is a 51 y.o. female presenting on 11/19/2021 for genesight  Patient is here for GeneSight testing. She has previously failed treatment with Wellbutrin. She is current taking Prozac and BuSpar and does not feel well controlled.     11/19/2021    9:13 AM 11/14/2021   11:54 AM 12/25/2020    8:32 AM  Depression screen PHQ 2/9  Decreased Interest 2 2 2   Down, Depressed, Hopeless 2 2 0  PHQ - 2 Score 4 4 2   Altered sleeping 2 2 3   Tired, decreased energy 2 2 1   Change in appetite 2 1 0  Feeling bad or failure about yourself  2 2 1   Trouble concentrating 1 0 1  Moving slowly or fidgety/restless 1 1 0  Suicidal thoughts 1 0 0  PHQ-9 Score 15 12 8   Difficult doing work/chores Somewhat difficult Somewhat difficult Somewhat difficult      11/19/2021    9:14 AM 11/14/2021   11:55 AM 12/25/2020    8:32 AM 11/24/2019    8:42 AM  GAD 7 : Generalized Anxiety Score  Nervous, Anxious, on Edge 2 2 0 1  Control/stop worrying 1 1 0 1  Worry too much - different things 2 1 0 1  Trouble relaxing 2 2 0 1  Restless 1 1 0 1  Easily annoyed or irritable 2 2 1 1   Afraid - awful might happen 2 1 1 1   Total GAD 7 Score 12 10 2 7   Anxiety Difficulty Somewhat difficult Somewhat difficult Somewhat difficult Somewhat difficult    ROS: Negative unless specifically indicated above in HPI.   Relevant past medical history reviewed and updated as indicated.   Allergies and medications reviewed and updated.   Current Outpatient Medications:    albuterol (PROVENTIL HFA) 108 (90 Base) MCG/ACT inhaler, INHALE 2 PUFFS EVERY 6 (SIX) HOURS AS NEEDED  INTO THE LUNGS FOR WHEEZING OR SHORTNESS OF BREATH., Disp: 6.7 g, Rfl: 1   busPIRone (BUSPAR) 30 MG tablet, Take 1 tablet (30 mg total) by mouth 2 (two) times daily., Disp: 180 tablet, Rfl: 3   FLUoxetine (PROZAC) 20 MG capsule, Take 2 capsules (40 mg total) by mouth daily., Disp: 180 capsule, Rfl: 3   gabapentin (NEURONTIN) 300 MG capsule, TAKE 1 CAPSULE BY MOUTH EVERYDAY AT BEDTIME, Disp: 90 capsule, Rfl: 3   ibuprofen (ADVIL,MOTRIN) 200 MG tablet, Take 200 mg by mouth every 6 (six) hours as needed for moderate pain., Disp: , Rfl:    omeprazole (PRILOSEC) 20 MG capsule, TAKE 1 CAPSULE BY MOUTH EVERY DAY, Disp: 90 capsule, Rfl: 3  Allergies  Allergen Reactions   Codeine Nausea And Vomiting   Bactrim [Sulfamethoxazole-Trimethoprim] Hives   Wellbutrin [Bupropion]     extreme irritability    Objective:   BP 132/75   Pulse 88   Temp 97.8 F (36.6 C) (Temporal)   Ht 4' 11.5" (1.511 m)   Wt 200 lb 9.6 oz (91 kg)   LMP 08/31/2016   SpO2 97%   BMI 39.84 kg/m    Physical Exam Vitals reviewed.  Constitutional:      General:  She is not in acute distress.    Appearance: Normal appearance. She is not ill-appearing, toxic-appearing or diaphoretic.  HENT:     Head: Normocephalic and atraumatic.  Eyes:     General: No scleral icterus.       Right eye: No discharge.        Left eye: No discharge.     Conjunctiva/sclera: Conjunctivae normal.  Cardiovascular:     Rate and Rhythm: Normal rate.  Pulmonary:     Effort: Pulmonary effort is normal. No respiratory distress.  Musculoskeletal:        General: Normal range of motion.     Cervical back: Normal range of motion.  Skin:    General: Skin is warm and dry.     Capillary Refill: Capillary refill takes less than 2 seconds.  Neurological:     General: No focal deficit present.     Mental Status: She is alert and oriented to person, place, and time. Mental status is at baseline.  Psychiatric:        Mood and Affect: Mood normal.         Behavior: Behavior normal.        Thought Content: Thought content normal.        Judgment: Judgment normal.

## 2021-11-26 ENCOUNTER — Ambulatory Visit: Payer: BC Managed Care – PPO | Admitting: Family Medicine

## 2021-12-01 ENCOUNTER — Ambulatory Visit: Payer: BC Managed Care – PPO | Admitting: Family Medicine

## 2021-12-08 ENCOUNTER — Encounter: Payer: Self-pay | Admitting: Family Medicine

## 2021-12-08 ENCOUNTER — Ambulatory Visit (INDEPENDENT_AMBULATORY_CARE_PROVIDER_SITE_OTHER): Payer: BC Managed Care – PPO | Admitting: Family Medicine

## 2021-12-08 VITALS — Wt 200.0 lb

## 2021-12-08 DIAGNOSIS — R7303 Prediabetes: Secondary | ICD-10-CM

## 2021-12-08 DIAGNOSIS — E7800 Pure hypercholesterolemia, unspecified: Secondary | ICD-10-CM

## 2021-12-08 DIAGNOSIS — R41840 Attention and concentration deficit: Secondary | ICD-10-CM

## 2021-12-08 DIAGNOSIS — F4322 Adjustment disorder with anxiety: Secondary | ICD-10-CM | POA: Diagnosis not present

## 2021-12-08 MED ORDER — CITALOPRAM HYDROBROMIDE 40 MG PO TABS: 40.0000 mg | ORAL_TABLET | Freq: Every day | ORAL | 0 refills | Status: DC

## 2021-12-08 MED ORDER — SEMAGLUTIDE (1 MG/DOSE) 4 MG/3ML ~~LOC~~ SOPN: PEN_INJECTOR | SUBCUTANEOUS | 0 refills | Status: DC

## 2021-12-08 NOTE — Progress Notes (Signed)
Telephone visit  Subjective: IR:SWNIOE up anxiety PCP: Raliegh Ip, DO VOJ:[id number] Tracy [name] is a 51 y.o. female calls for telephone consult today. Patient provides verbal consent for consult held via phone.  Due to COVID-19 pandemic this visit was conducted virtually. This visit type was conducted due to national recommendations for restrictions regarding the COVID-19 Pandemic (e.g. social distancing, sheltering in place) in an effort to limit this patient's exposure and mitigate transmission in our community. All issues noted in this document were discussed and addressed.  A physical exam was not performed with this format.   Location of patient: work Location of provider: [facility] Others present for call: none  1. Anxiety Patient here to discuss most recent geneSite testing. She is interested in sticking with the SSRI class for ease of transition.  Previously treated with Zoloft which caused her to grind her teeth at night.  She was later treated with Prozac and advance to 60 mg but this caused her to feel to on edge and so it was reduced back to 40 mg.  She is concomitantly treated with BuSpar 30 mg 2 times daily.  She reports difficulty with concentration and wonders if perhaps she has an underlying ADHD.  She would like to have a referral to be further evaluated for this as she is frequently late has difficulty staying on task.  2.  Morbid obesity associated with prediabetes, hyperlipidemia Unfortunately her insurance will not pay for weight loss medications that she would like to proceed with compounded semaglutide with B12 from our local pharmacy.  No apparent contraindications to use.  3.    ROS: Per HPI  Allergies  Allergen Reactions   Codeine Nausea And Vomiting   Bactrim [Sulfamethoxazole-Trimethoprim] Hives   Wellbutrin [Bupropion]     extreme irritability   Past Medical History:  Diagnosis Date   Anxiety    Depression    GERD (gastroesophageal reflux disease)     Hyperlipidemia    PONV (postoperative nausea and vomiting)    had to use a scop patch 2nd c-sec   Wears dentures    top    Current Outpatient Medications:    albuterol (PROVENTIL HFA) 108 (90 Base) MCG/ACT inhaler, INHALE 2 PUFFS EVERY 6 (SIX) HOURS AS NEEDED INTO THE LUNGS FOR WHEEZING OR SHORTNESS OF BREATH., Disp: 6.7 g, Rfl: 1   busPIRone (BUSPAR) 30 MG tablet, Take 1 tablet (30 mg total) by mouth 2 (two) times daily., Disp: 180 tablet, Rfl: 3   FLUoxetine (PROZAC) 20 MG capsule, Take 2 capsules (40 mg total) by mouth daily., Disp: 180 capsule, Rfl: 3   gabapentin (NEURONTIN) 300 MG capsule, TAKE 1 CAPSULE BY MOUTH EVERYDAY AT BEDTIME, Disp: 90 capsule, Rfl: 3   ibuprofen (ADVIL,MOTRIN) 200 MG tablet, Take 200 mg by mouth every 6 (six) hours as needed for moderate pain., Disp: , Rfl:    omeprazole (PRILOSEC) 20 MG capsule, TAKE 1 CAPSULE BY MOUTH EVERY DAY, Disp: 90 capsule, Rfl: 3  Assessment/ Plan: 51 y.o. female   Adjustment disorder with anxious mood - Plan: citalopram (CELEXA) 40 MG tablet  Concentration deficit - Plan: Ambulatory referral to Psychiatry  Morbid obesity (HCC) - Plan: Semaglutide, 1 MG/DOSE, 4 MG/3ML SOPN  Pre-diabetes - Plan: Semaglutide, 1 MG/DOSE, 4 MG/3ML SOPN  Pure hypercholesterolemia - Plan: Semaglutide, 1 MG/DOSE, 4 MG/3ML SOPN  Stop Prozac.  May start Celexa tomorrow at equivalent dose of 40 mg.  Continue BuSpar.  8-week follow-up has been scheduled for patient.  Encouraged her  to contact me sooner if concerns arise.  If Celexa not helpful, would transition over to SNRI class as this would be multiple failures of SSRI class.  Referral to attention specialist for evaluation of adult onset ADHD placed.  We will follow-up these results at her next visit and start medication if appropriate  We will fax semaglutide mixed with B12 to [facility].  She understands possible side effects.  Red flag signs and symptoms have been previously discussed.  Start  time: 8:44am End time: 8:59a  Total time spent on patient care (including telephone call/ virtual visit): 14 minutes  [name], DO [facility] [phone]

## 2022-01-02 ENCOUNTER — Other Ambulatory Visit: Payer: Self-pay | Admitting: Family Medicine

## 2022-01-02 DIAGNOSIS — R062 Wheezing: Secondary | ICD-10-CM

## 2022-02-06 ENCOUNTER — Ambulatory Visit (INDEPENDENT_AMBULATORY_CARE_PROVIDER_SITE_OTHER): Payer: BC Managed Care – PPO | Admitting: Family Medicine

## 2022-02-06 ENCOUNTER — Encounter: Payer: Self-pay | Admitting: Family Medicine

## 2022-02-06 VITALS — BP 121/83 | HR 77 | Temp 98.7°F | Ht 59.5 in | Wt 200.0 lb

## 2022-02-06 DIAGNOSIS — R41840 Attention and concentration deficit: Secondary | ICD-10-CM

## 2022-02-06 DIAGNOSIS — F4322 Adjustment disorder with anxiety: Secondary | ICD-10-CM | POA: Diagnosis not present

## 2022-02-06 NOTE — Patient Instructions (Signed)
Dr Nehemiah Settle in [place] should call soon for appointment I question I we are focusing on the wrong chemical.  Perhaps norepinephrine or dopamine are lacking?  Will defer this to him.

## 2022-02-06 NOTE — Progress Notes (Unsigned)
Subjective: PI:Tracy [name] PCP: Raliegh Ip, DO ACZ:YSAYT Tracy [name] is a 51 y.o. female presenting to clinic today for:  1. ***   ROS: Per HPI  Allergies  Allergen Reactions   Codeine Nausea And Vomiting   Bactrim [Sulfamethoxazole-Trimethoprim] Hives   Wellbutrin [Bupropion]     extreme irritability   Past Medical History:  Diagnosis Date   Anxiety    Depression    GERD (gastroesophageal reflux disease)    Hyperlipidemia    PONV (postoperative nausea and vomiting)    had to use a scop patch 2nd c-sec   Wears dentures    top    Current Outpatient Medications:    albuterol (VENTOLIN HFA) 108 (90 Base) MCG/ACT inhaler, INHALE 2 PUFFS EVERY 6 (SIX) HOURS AS NEEDED INTO THE LUNGS FOR WHEEZING OR SHORTNESS OF BREATH., Disp: 6.7 each, Rfl: 0   busPIRone (BUSPAR) 30 MG tablet, Take 1 tablet (30 mg total) by mouth 2 (two) times daily., Disp: 180 tablet, Rfl: 3   citalopram (CELEXA) 40 MG tablet, Take 1 tablet (40 mg total) by mouth daily. To replace the Prozac, Disp: 90 tablet, Rfl: 0   gabapentin (NEURONTIN) 300 MG capsule, TAKE 1 CAPSULE BY MOUTH EVERYDAY AT BEDTIME, Disp: 90 capsule, Rfl: 3   ibuprofen (ADVIL,MOTRIN) 200 MG tablet, Take 200 mg by mouth every 6 (six) hours as needed for moderate pain., Disp: , Rfl:    omeprazole (PRILOSEC) 20 MG capsule, TAKE 1 CAPSULE BY MOUTH EVERY DAY, Disp: 90 capsule, Rfl: 3   Semaglutide, 1 MG/DOSE, 4 MG/3ML SOPN, Semaglutide:B12 from [facility] sublingually qd as directed, Disp: 3 mL, Rfl: 0 Social History   Socioeconomic History   Marital status: Single    Spouse name: Not on file   Number of children: Not on file   Years of education: Not on file   Highest education level: Not on file  Occupational History   Not on file  Tobacco Use   Smoking status: Every Day    Packs/day: 1.00    Types: Cigarettes   Smokeless tobacco: Never  Vaping Use   Vaping Use: Never used  Substance and Sexual Activity   Alcohol use: No     Comment: rare   Drug use: No   Sexual activity: Yes    Birth control/protection: None  Other Topics Concern   Not on file  Social History Narrative   Not on file   Social Determinants of Health   Financial Resource Strain: Not on file  Food Insecurity: Not on file  Transportation Needs: Not on file  Physical Activity: Not on file  Stress: Not on file  Social Connections: Not on file  Intimate Partner Violence: Not on file   Family History  Problem Relation Age of Onset   COPD Mother    Heart disease Mother    Heart disease Father    Diabetes Father    Heart disease Brother    Breast cancer Other        aunt   Colon cancer Other        great uncle   Lung cancer Other        uncle   Esophageal cancer Other        grandfather    Objective: Office vital signs reviewed. BP 121/83   Pulse 77   Temp 98.7 F (37.1 C)   Ht 4' 11.5" (1.511 m)   Wt 200 lb (90.7 kg)   LMP 08/31/2016   SpO2 99%  BMI 39.72 kg/m   Physical Examination:  General: Awake, alert, *** nourished, No acute distress HEENT: Normal    Neck: No masses palpated. No lymphadenopathy    Ears: Tympanic membranes intact, normal light reflex, no erythema, no bulging    Eyes: PERRLA, extraocular membranes intact, sclera ***    Nose: nasal turbinates moist, *** nasal discharge    Throat: moist mucus membranes, no erythema, *** tonsillar exudate.  Airway is patent Cardio: regular rate and rhythm, S1S2 heard, no murmurs appreciated Pulm: clear to auscultation bilaterally, no wheezes, rhonchi or rales; normal work of breathing on room air GI: soft, non-tender, non-distended, bowel sounds present x4, no hepatomegaly, no splenomegaly, no masses GU: external vaginal tissue ***, cervix ***, *** punctate lesions on cervix appreciated, *** discharge from cervical os, *** bleeding, *** cervical motion tenderness, *** abdominal/ adnexal masses Extremities: warm, well perfused, No edema, cyanosis or clubbing; +***  pulses bilaterally MSK: *** gait and *** station Skin: dry; intact; no rashes or lesions Neuro: *** Strength and light touch sensation grossly intact, *** DTRs ***/4     02/06/2022    3:12 PM 11/19/2021    9:13 AM 11/14/2021   11:54 AM  Depression screen PHQ 2/9  Decreased Interest 2 2 2   Down, Depressed, Hopeless 1 2 2   PHQ - 2 Score 3 4 4   Altered sleeping 3 2 2   Tired, decreased energy 3 2 2   Change in appetite 3 2 1   Feeling bad or failure about yourself  1 2 2   Trouble concentrating 1 1 0  Moving slowly or fidgety/restless 1 1 1   Suicidal thoughts 0 1 0  PHQ-9 Score 15 15 12   Difficult doing work/chores  Somewhat difficult Somewhat difficult      02/06/2022    3:13 PM 11/19/2021    9:14 AM 11/14/2021   11:55 AM 12/25/2020    8:32 AM  GAD 7 : Generalized Anxiety Score  Nervous, Anxious, on Edge 1 2 2  0  Control/stop worrying 0 1 1 0  Worry too much - different things 1 2 1  0  Trouble relaxing 2 2 2  0  Restless 2 1 1  0  Easily annoyed or irritable 2 2 2 1   Afraid - awful might happen 2 2 1 1   Total GAD 7 Score 10 12 10 2   Anxiety Difficulty Very difficult Somewhat difficult Somewhat difficult Somewhat difficult    Assessment/ Plan: 51 y.o. female   ***  No orders of the defined types were placed in this encounter.  No orders of the defined types were placed in this encounter.    Janora Norlander, DO [place] [phone]

## 2022-03-08 ENCOUNTER — Other Ambulatory Visit: Payer: Self-pay | Admitting: Family Medicine

## 2022-03-08 DIAGNOSIS — F4322 Adjustment disorder with anxiety: Secondary | ICD-10-CM

## 2022-04-21 ENCOUNTER — Encounter (HOSPITAL_COMMUNITY): Payer: Self-pay | Admitting: Psychiatry

## 2022-04-21 ENCOUNTER — Ambulatory Visit (INDEPENDENT_AMBULATORY_CARE_PROVIDER_SITE_OTHER): Payer: BC Managed Care – PPO | Admitting: Psychiatry

## 2022-04-21 DIAGNOSIS — F1290 Cannabis use, unspecified, uncomplicated: Secondary | ICD-10-CM | POA: Diagnosis not present

## 2022-04-21 DIAGNOSIS — F17210 Nicotine dependence, cigarettes, uncomplicated: Secondary | ICD-10-CM

## 2022-04-21 DIAGNOSIS — F1590 Other stimulant use, unspecified, uncomplicated: Secondary | ICD-10-CM

## 2022-04-21 DIAGNOSIS — F17200 Nicotine dependence, unspecified, uncomplicated: Secondary | ICD-10-CM

## 2022-04-21 DIAGNOSIS — F410 Panic disorder [episodic paroxysmal anxiety] without agoraphobia: Secondary | ICD-10-CM | POA: Diagnosis not present

## 2022-04-21 DIAGNOSIS — F15982 Other stimulant use, unspecified with stimulant-induced sleep disorder: Secondary | ICD-10-CM | POA: Insufficient documentation

## 2022-04-21 DIAGNOSIS — F331 Major depressive disorder, recurrent, moderate: Secondary | ICD-10-CM

## 2022-04-21 DIAGNOSIS — F411 Generalized anxiety disorder: Secondary | ICD-10-CM | POA: Diagnosis not present

## 2022-04-21 MED ORDER — BUSPIRONE HCL 15 MG PO TABS: ORAL_TABLET | ORAL | 0 refills | Status: AC

## 2022-04-21 MED ORDER — NICOTINE 21 MG/24HR TD PT24: 21.0000 mg | MEDICATED_PATCH | Freq: Every day | TRANSDERMAL | 0 refills | Status: AC

## 2022-04-21 NOTE — Patient Instructions (Addendum)
We changed her BuSpar today.  Please take 1 tablet (15 mg) by mouth twice daily for 1 week.  Take half a tablet by mouth twice daily for a week.  Take half a tablet once daily for 1 week and stop.  You can start to cut back on caffeine use gradually reducing by 1 can of soda or a cup of coffee every 3 to 5 days.  I also sent in nicotine patches which she can wear during the day but be sure to take them off at nighttime.
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
## 2022-06-10 ENCOUNTER — Other Ambulatory Visit: Payer: Self-pay | Admitting: Family Medicine

## 2022-06-10 DIAGNOSIS — F4322 Adjustment disorder with anxiety: Secondary | ICD-10-CM

## 2023-01-29 ENCOUNTER — Other Ambulatory Visit (HOSPITAL_COMMUNITY): Payer: Self-pay | Admitting: Psychiatry

## 2023-01-29 ENCOUNTER — Other Ambulatory Visit: Payer: Self-pay | Admitting: Family Medicine

## 2023-01-29 DIAGNOSIS — K219 Gastro-esophageal reflux disease without esophagitis: Secondary | ICD-10-CM

## 2023-01-29 DIAGNOSIS — F17200 Nicotine dependence, unspecified, uncomplicated: Secondary | ICD-10-CM
